# Patient Record
Sex: Male | Born: 1960 | Race: White | Hispanic: No | Marital: Married | Smoking: Never smoker
Health system: Southern US, Community
[De-identification: ages and names within clinical notes are randomized; demographics above are authoritative.]

## PROBLEM LIST (undated history)

## (undated) DIAGNOSIS — Z789 Other specified health status: Secondary | ICD-10-CM

## (undated) HISTORY — PX: VASECTOMY: SHX75

## (undated) HISTORY — PX: KNEE SURGERY: SHX244

---

## 2006-08-20 ENCOUNTER — Ambulatory Visit: Payer: Self-pay | Admitting: Cardiology

## 2006-09-07 ENCOUNTER — Ambulatory Visit: Payer: Self-pay

## 2006-09-20 ENCOUNTER — Ambulatory Visit: Payer: Self-pay | Admitting: Cardiology

## 2010-08-26 NOTE — Assessment & Plan Note (Signed)
[facility]                            CARDIOLOGY OFFICE NOTE   [name], [name]                        MRN:          [id number]  DATE:09/20/2006                            DOB:          06-25-1960    PRIMARY CARE:  Dr. Hal Hope, [facility].   REASON FOR VISIT:  Followup cardiac testing.   HISTORY OF PRESENT ILLNESS:  I saw Justin Wood back in May.  He had a  history of atypical chest pain which was potentially gastrointestinal  based on description, although he had not undergone any prior cardiac  risk stratification.  His resting electrocardiogram showed nonspecific T  wave changes.  I referred him for an exercise Myoview which was low  risk, showing an ejection fraction of 68% with no evidence of scar or  ischemia.  I reviewed these results with him today.  He states that he  has been trying to modify his diet and reports that his symptoms have  generally improved.   ALLERGIES:  No known drug allergies.   PRESENT MEDICATIONS:  1. Aspirin 81 mg p.o. daily.  2. Prilosec 20 mg p.o. daily.   REVIEW OF SYSTEM:  As per history of present illness.   EXAMINATION:  Blood pressure 108/73, heart rate 71, weight 206 pounds.  The patient is comfortable and in no acute distress.  NECK:  No elevated jugular venous pressure, without bruits.  LUNGS:  Are clear to breathing.  CARDIAC:      Reveals a regular rate and rhythm without murmur or  gallop.  EXTREMITIES:  Show no pitting edema.   IMPRESSION AND RECOMMENDATIONS:  1. A history of atypical chest pain, possibly gastrointestinal in      etiology.  Recent exercise Myoview was low risk, overall      reassuring.  I would not anticipate any additional cardiac studies      at this point.  I have recommended that he continue symptom      observation and regular followup with his primary care physician.  2. Cardiology followup as needed.     Jonelle Sidle, MD  Electronically  Signed   SGM/MedQ  DD: 09/20/2006  DT: 09/20/2006  Job #: [id number]   cc:   Clydie Braun L. Hal Hope, M.D.

## 2010-08-26 NOTE — Assessment & Plan Note (Signed)
[facility]                            CARDIOLOGY OFFICE NOTE   Justin Wood, Justin Wood                        MRN:          [id number]  DATE:08/20/2006                            DOB:          03-23-1961    CARDIOLOGY CONSULTATION  The patient is referred from [facility], Dr.  Hal Hope.   REASON FOR CONSULTATION:  Chest pain.   HISTORY OF PRESENT ILLNESS:  Justin Wood is a 50 year old male with no  personal history of coronary artery disease, hypertension,  hyperlipidemia, type 2 diabetes mellitus, or tobacco use.  He also has  no family history of cardiovascular disease by report.  He states that,  over the last 6 months he has been experiencing a heaviness in his chest  intermittently to start and then almost continuously.  Exacerbating  factors include emotional stress, and also when he eats too fast.  He  has had some episodes at night time when he would wake up with a  burning in his throat and a feeling that he needed to vomit.  He has  experienced dyspnea on exertion as well.  Back in 1997, he had an  exercise treadmill test, which was reportedly normal.  He has not had  any followup testing other than some recent rest electrocardiograms,  which showed sinus rhythm with nonspecific T wave changes and normal  intervals.  He was given a prescription for proton pump inhibitor, but  has not yet filled this.  He is referred today to discuss additional  risk stratification.   ALLERGIES:  No known drug allergies.   PRESENT MEDICATIONS:  None chronically.   PAST MEDICAL HISTORY:  As outlined above.  No reported major surgeries  or other medical conditions.   REVIEW OF SYSTEMS:  As described in the history of present illness.  He  does experience occasional congestion.  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  He is not exercising  regularly.   SOCIAL HISTORY:  The patient is married and  has 1 child.  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  He has no tobacco or alcohol use  history.   FAMILY HISTORY:  Noncontributory for premature cardiovascular disease.   EXAMINATION:  Blood pressure is 100/79, heart rate 82, weight 206  pounds.  The patient is comfortable and in no acute distress.  HEENT:  Conjunctivae look normal.  Oropharynx is clear.  NECK:  Supple.  No elevated jugular venous pressure or loud bruits.  No  thyromegaly is noted.  LUNGS:  Clear without labored breathing at rest.  CARDIAC:  Regular rate and rhythm.  No loud murmur or gallop.  No  thyromegaly.  No S3 gallop.  No pericardial rub.  ABDOMEN:  Soft and nontender.  No bruits.  EXTREMITIES:  No pitting edema.  SKIN:  Warm and dry.  MUSCULOSKELETAL:  No kyphosis noted.  NEURO/PSYCH:  The patient is alert and oriented x3.  Affect is normal.   IMPRESSION/RECOMMENDATIONS:  1. Atypical chest pain, nearly continuous at this  point, and      potentially suggestive of a gastrointestinal etiology.  He has not      had any followup ischemic testing/risk stratification, however.      Electrocardiogram is nonspecific at rest.  We discussed potential      avenues for diagnosis, including both noninvasive and invasive      techniques.  At this point, he is most comfortable with noninvasive      testing.  We will proceed with an exercise Myoview.  Will have him      follow up in the clinic to discuss the results.  In the meanwhile,      I have encouraged him to begin the trial of proton pump inhibitor.  2. Further plan is to follow.     Jonelle Sidle, MD  Electronically Signed    SGM/MedQ  DD: 08/20/2006  DT: 08/20/2006  Job #: [id number]   cc:   Clydie Braun L. Hal Hope, M.D.

## 2011-04-26 ENCOUNTER — Emergency Department (HOSPITAL_COMMUNITY): Payer: Commercial Managed Care - PPO

## 2011-04-26 ENCOUNTER — Observation Stay (HOSPITAL_COMMUNITY)
Admission: EM | Admit: 2011-04-26 | Discharge: 2011-04-28 | Disposition: A | Discharge status: Home / Self Care | Payer: Commercial Managed Care - PPO | Attending: Internal Medicine | Admitting: Internal Medicine

## 2011-04-26 ENCOUNTER — Other Ambulatory Visit: Payer: Self-pay

## 2011-04-26 ENCOUNTER — Encounter (HOSPITAL_COMMUNITY): Payer: Self-pay | Admitting: Adult Health

## 2011-04-26 DIAGNOSIS — R29898 Other symptoms and signs involving the musculoskeletal system: Secondary | ICD-10-CM | POA: Insufficient documentation

## 2011-04-26 DIAGNOSIS — R109 Unspecified abdominal pain: Secondary | ICD-10-CM | POA: Insufficient documentation

## 2011-04-26 DIAGNOSIS — K219 Gastro-esophageal reflux disease without esophagitis: Secondary | ICD-10-CM | POA: Insufficient documentation

## 2011-04-26 DIAGNOSIS — R12 Heartburn: Secondary | ICD-10-CM | POA: Insufficient documentation

## 2011-04-26 DIAGNOSIS — R209 Unspecified disturbances of skin sensation: Secondary | ICD-10-CM | POA: Insufficient documentation

## 2011-04-26 DIAGNOSIS — R0602 Shortness of breath: Secondary | ICD-10-CM | POA: Insufficient documentation

## 2011-04-26 DIAGNOSIS — R079 Chest pain, unspecified: Secondary | ICD-10-CM | POA: Diagnosis present

## 2011-04-26 DIAGNOSIS — R61 Generalized hyperhidrosis: Secondary | ICD-10-CM | POA: Insufficient documentation

## 2011-04-26 DIAGNOSIS — R0789 Other chest pain: Principal | ICD-10-CM | POA: Insufficient documentation

## 2011-04-26 HISTORY — DX: Other specified health status: Z78.9

## 2011-04-26 LAB — LIPASE, BLOOD: Lipase: 38 U/L (ref 11–59)

## 2011-04-26 LAB — HEPATIC FUNCTION PANEL: Total Protein: 6.9 g/dL (ref 6.0–8.3)

## 2011-04-26 LAB — BASIC METABOLIC PANEL
GFR calc Af Amer: >90 mL/min (ref >90)
GFR calc non Af Amer: >90 mL/min (ref >90)
Glucose, Bld: 106 mg/dL — ABNORMAL HIGH (ref 70–99)
Potassium: 3.9 mEq/L (ref 3.5–5.1)
Sodium: 137 mEq/L (ref 135–145)

## 2011-04-26 LAB — CBC
Hemoglobin: 14.9 g/dL (ref 13.0–17.0)
MCHC: 34.7 g/dL (ref 30.0–36.0)
RDW: 12.2 % (ref 11.5–15.5)

## 2011-04-26 LAB — POCT I-STAT TROPONIN I: Troponin i, poc: 0 ng/mL (ref 0.00–0.08)

## 2011-04-26 MED ORDER — ASPIRIN 325 MG PO TABS
325.0000 mg | ORAL_TABLET | ORAL | Status: AC
  Administered 2011-04-26: 325 mg via ORAL

## 2011-04-26 MED ORDER — GI COCKTAIL ~~LOC~~
30.0000 mL | Freq: Once | ORAL | Status: AC
  Administered 2011-04-26: 30 mL via ORAL
  Filled 2011-04-26: qty 30

## 2011-04-26 MED ORDER — MORPHINE SULFATE 4 MG/ML IJ SOLN
4.0000 mg | Freq: Once | INTRAMUSCULAR | Status: DC
  Filled 2011-04-26: qty 1

## 2011-04-26 MED ORDER — NITROGLYCERIN 0.4 MG SL SUBL
0.4000 mg | SUBLINGUAL_TABLET | SUBLINGUAL | Status: DC | PRN
  Administered 2011-04-26: 0.4 mg via SUBLINGUAL
  Filled 2011-04-26: qty 25

## 2011-04-26 MED ORDER — NITROGLYCERIN 0.4 MG SL SUBL: 0.4000 mg | SUBLINGUAL_TABLET | SUBLINGUAL | Status: DC | PRN

## 2011-04-26 MED ORDER — PANTOPRAZOLE SODIUM 40 MG IV SOLR
40.0000 mg | Freq: Once | INTRAVENOUS | Status: AC
  Administered 2011-04-26: 40 mg via INTRAVENOUS
  Filled 2011-04-26: qty 40

## 2011-04-26 NOTE — ED Notes (Signed)
Reports chest pain that began in church described as heavy and dull that radiates to left arm associated with SOB and diaphoresis

## 2011-04-26 NOTE — H&P (Addendum)
Primary Care Physician: None   Chief Complaint: Chest pain for one day  History of Present Illness: Patient is a 51 year old gentleman with occasional heartburn but otherwise healthy who presents for evaluation of chest pain. Patient reports that he was in his usual state of health until earlier today when he was at church and experienced a dull pain arising from the lower aspect of the left chest. Reports associated diaphoresis and weakness/numbness of his left arm. No other radiation. No headache, vision changes, shortness of breath, nausea or vomiting. Reports having occasional GERD/heartburn symptoms and reporting that this pain is nothing like the burning sensation that he gets. Had to have a stress test approximately 20 years ago for similar pain which was reportedly negative. Reports that he does not actively exercise but is able to go up a flight of stairs and perform other activities of daily living without any difficulty. No recent trauma. No history of tobacco. Does not take any regular medications at home.  No recent dyspnea on exertion, orthopnea, or lower extremity edema.  In the emergency room, temperature 97.8, blood pressure 132/79, heart rate 92, respirations 16, satting 95% on room air. Initial EKG and troponin negative for ischemic changes. Chest radiograph negative for any acute process. Chest pain slightly improved after administration of a full dose aspirin, nitroglycerin, and morphine. Protonix also given. GI cocktail reportedly made pain worse. The patient was admitted for further evaluation and management.  Past Medical/Surgical History: Occasional heartburn symptoms, symptomatically treated  Allergies: No known drug allergies  Medications: None  Family History: No history of CAD or MI  Social History: Married, works as a Counsellor Denies any tobacco, EtOH, or illicits  Review of Systems: General: No fevers, chills, sweats, night sweats, weight loss Skin: No  rashes or lacerations HEENT: No rhinorrhea, sore throat, dry mouth, hearing difficulties Pulmonary: No cough, wheezing, shortness of breath Cardivascular: As per HPI Gastrointestinal: As per HPI Genitourinary: No dysuria, hematuria, increased urinary frequency/urgency. No discharge Musculoskeletal: No muscle aches, pain. No arthritis Hematologic: No easy bruising or bleeding Neurologic: No headaches, vision changes, focal neurologic deficits Psychologic: No suicidial or homicidal ideation. No depression  Filed Vitals:   04/26/11 1408 04/26/11 1454 04/26/11 1818  BP: 132/79 125/70 120/69  Pulse: 92 87 68  Temp: 97.8 F (36.6 C)    TempSrc: Oral    Resp: 16  18  SpO2: 95% 100% 100%    Physical Exam: General: Alert and oriented x 3, no apparent distress Skin: No rashes, bruises HEENT: Head atraumatic, sclera anicertic, pupils equal and reactive to light, oropharynx moist with tonsils unremarkable Neck: Soft, no lymphadenopathy, thyromegaly, or bruits Chest: Clear to auscultation bilaterally, no wheezes, rales, or ronchi Heart: Regular rate and rhythm, normal S1/S2 no rubs, gallops, or murmurs Abdomen: Soft, nontender, nondistended, + bowel sounds, no masses Extremities: No cyanosis, clubbing, or edema. 2+ radial and dorsalis pedis pulses bilaterally Neurologic: Grossly intact   Labs: CBC    Component Value Date/Time   WBC 7.5 04/26/2011 1440   RBC 4.82 04/26/2011 1440   HGB 14.9 04/26/2011 1440   HCT 43.0 04/26/2011 1440   PLT 272 04/26/2011 1440   MCV 89.2 04/26/2011 1440   MCH 30.9 04/26/2011 1440   MCHC 34.7 04/26/2011 1440   RDW 12.2 04/26/2011 1440    BMET    Component Value Date/Time   NA 137 04/26/2011 1440   K 3.9 04/26/2011 1440   CL 102 04/26/2011 1440   CO2 25 04/26/2011 1440  GLUCOSE 106* 04/26/2011 1440   BUN 14 04/26/2011 1440   CREATININE 0.79 04/26/2011 1440   CALCIUM 9.6 04/26/2011 1440   GFRNONAA >90 04/26/2011 1440   GFRAA >90 04/26/2011 1440    Liver  function tests: AST 21, ALT 31, alkaline phosphatase 70, total bilirubin 0.4, total protein 6.9, albumin 3.6  Troponin 0.00  EKG (my read): Sinus rhythm with no ischemic changes  Chest radiograph: IMPRESSION:  Low lung volumes, otherwise no acute cardiopulmonary abnormality.   Impression/Plan: 51 year old gentleman with occasional heartburn but otherwise healthy who presents for evaluation of chest pain potential cardiac etiology, currently afebrile and hemodynamically stable with no ischemic changes on EKG and initial troponin negative. The patient does not have any traditional risk factors. However, history is concerning for possible angina.  Differential considerations also include possible GI etiology although less likely especially given pain worsened with GI cocktail.   Left sided chest pain: As above - Admit to telemetry - Monitor serial cardiac enzymes - Discussed with cardiologist on call, will place for exercise stress test in the morning - Continue Aspirin for now - NPO post midnight  Fluid/electrolytes/nutrition: - Hep-locked IV - Monitor electrolytes daily - Cardiac diet, NPO post midnight as above  Prophylaxis: - Lovenox  CODE STATUS: Full code

## 2011-04-26 NOTE — ED Provider Notes (Signed)
History     CSN: [id number]  Arrival date & time 04/26/11  1356   First MD Initiated Contact with Patient 04/26/11 1512      Chief Complaint  Patient presents with  . Chest Pain    (Consider location/radiation/quality/duration/timing/severity/associated sxs/prior treatment) Patient is a 51 y.o. male presenting with chest pain. The history is provided by the patient.  Chest Pain Duration of episode(s) is 3 months. The chest pain is improving. The pain is associated with eating. The quality of the pain is described as aching. The pain does not radiate. Chest pain is worsened by eating. Primary symptoms include shortness of breath and abdominal pain. Pertinent negatives for primary symptoms include no fever, no cough, no wheezing, no palpitations, no nausea, no vomiting and no dizziness.  Associated symptoms include diaphoresis.  Pertinent negatives for associated symptoms include no numbness and no weakness.   Pt states he has had episodic epigastric/chest pain since thanksgiving. This morning pt ate and began to have sharp epigastric pain. Became diaphoretic. Pain has improved but now feels dull in nature. No fever chill diarrhea   History reviewed. No pertinent past medical history.  History reviewed. No pertinent past surgical history.  History reviewed. No pertinent family history.  History  Substance Use Topics  . Smoking status: Never Smoker   . Smokeless tobacco: Not on file  . Alcohol Use: No      Review of Systems  Constitutional: Positive for diaphoresis. Negative for fever.  Respiratory: Positive for shortness of breath. Negative for cough, chest tightness and wheezing.   Cardiovascular: Positive for chest pain. Negative for palpitations.  Gastrointestinal: Positive for abdominal pain. Negative for nausea, vomiting, diarrhea, constipation and abdominal distention.  Skin: Negative for color change, pallor and rash.  Neurological: Negative for dizziness, weakness  and numbness.    Allergies  Review of patient's allergies indicates no known allergies.  Home Medications  No current outpatient prescriptions on file.  BP 125/70  Pulse 87  Temp(Src) 97.8 F (36.6 C) (Oral)  Resp 16  SpO2 100%  Physical Exam  Nursing note and vitals reviewed. Constitutional: He is oriented to person, place, and time. He appears well-developed and well-nourished. No distress.  HENT:  Head: Normocephalic and atraumatic.  Mouth/Throat: Oropharynx is clear and moist.  Eyes: EOM are normal. Pupils are equal, round, and reactive to light.  Neck: Normal range of motion. Neck supple.  Cardiovascular: Normal rate and regular rhythm.   Pulmonary/Chest: Effort normal and breath sounds normal. No respiratory distress. He has no wheezes. He has no rales.  Abdominal: Soft. Bowel sounds are normal. He exhibits no distension and no mass. There is no tenderness. There is no rebound and no guarding.  Musculoskeletal: Normal range of motion. He exhibits no edema and no tenderness.  Neurological: He is alert and oriented to person, place, and time.  Skin: Skin is warm and dry. No rash noted. No erythema.  Psychiatric: He has a normal mood and affect. His behavior is normal.    ED Course  Procedures (including critical care time)  Labs Reviewed  BASIC METABOLIC PANEL - Abnormal; Notable for the following:    Glucose, Bld 106 (*)    All other components within normal limits  CBC  POCT I-STAT TROPONIN I  HEPATIC FUNCTION PANEL  LIPASE, BLOOD  POCT CARDIAC MARKERS  I-STAT TROPONIN I   Dg Chest 2 View  04/26/2011  *RADIOLOGY REPORT*  Clinical Data: 51 year old male with chest pain and shortness of breath.  CHEST - 2 VIEW  Comparison: None.  Findings: Low lung volumes. Normal cardiac size and mediastinal contours.  Visualized tracheal air column is within normal limits. No pneumothorax, pulmonary edema, pleural effusion or consolidation.  Crowding of markings at both lung  bases.  Negative visualized bowel gas pattern. No acute osseous abnormality identified.  IMPRESSION: Low lung volumes, otherwise no acute cardiopulmonary abnormality.  Original Report Authenticated By: Harley Hallmark, M.D.     No diagnosis found.   Date: 04/26/2011  Rate: 89  Rhythm: normal sinus rhythm  QRS Axis: normal  Intervals: normal  ST/T Wave abnormalities: normal  Conduction Disutrbances:none  Narrative Interpretation:   Old EKG Reviewed: none available    MDM   Pain unchanged. Will admit for r/o.         Loren Racer, MD 04/26/11 1827

## 2011-04-27 ENCOUNTER — Encounter (HOSPITAL_COMMUNITY): Payer: Self-pay

## 2011-04-27 ENCOUNTER — Ambulatory Visit (HOSPITAL_COMMUNITY): Admit: 2011-04-27 | Payer: Commercial Managed Care - PPO

## 2011-04-27 LAB — CARDIAC PANEL(CRET KIN+CKTOT+MB+TROPI)
CK, MB: 2.6 ng/mL (ref 0.3–4.0)
CK, MB: 2.4 ng/mL (ref 0.3–4.0)
Relative Index: 2.3 (ref 0.0–2.5)
Relative Index: INVALID (ref 0.0–2.5)
Total CK: 104 U/L (ref 7–232)
Total CK: 103 U/L (ref 7–232)
Total CK: 98 U/L (ref 7–232)

## 2011-04-27 MED ORDER — SODIUM CHLORIDE 0.9 % IJ SOLN: 3.0000 mL | INTRAMUSCULAR | Status: DC | PRN

## 2011-04-27 MED ORDER — SODIUM CHLORIDE 0.9 % IV SOLN: 250.0000 mL | INTRAVENOUS | Status: DC | PRN

## 2011-04-27 MED ORDER — ENOXAPARIN SODIUM 40 MG/0.4ML ~~LOC~~ SOLN
40.0000 mg | SUBCUTANEOUS | Status: DC
  Administered 2011-04-27: 40 mg via SUBCUTANEOUS
  Filled 2011-04-27 (×2): qty 0.4

## 2011-04-27 MED ORDER — ASPIRIN EC 81 MG PO TBEC
81.0000 mg | DELAYED_RELEASE_TABLET | Freq: Every day | ORAL | Status: DC
  Administered 2011-04-28: 81 mg via ORAL
  Filled 2011-04-27 (×2): qty 1

## 2011-04-27 MED ORDER — ONDANSETRON HCL 4 MG/2ML IJ SOLN: 4.0000 mg | Freq: Four times a day (QID) | INTRAMUSCULAR | Status: DC | PRN

## 2011-04-27 MED ORDER — NITROGLYCERIN 0.4 MG SL SUBL: 0.4000 mg | SUBLINGUAL_TABLET | SUBLINGUAL | Status: DC | PRN

## 2011-04-27 MED ORDER — SODIUM CHLORIDE 0.9 % IJ SOLN
3.0000 mL | Freq: Two times a day (BID) | INTRAMUSCULAR | Status: DC
  Administered 2011-04-27: 3 mL via INTRAVENOUS

## 2011-04-27 MED ORDER — ACETAMINOPHEN 325 MG PO TABS
650.0000 mg | ORAL_TABLET | ORAL | Status: DC | PRN
  Administered 2011-04-27 – 2011-04-28 (×2): 650 mg via ORAL
  Filled 2011-04-27 (×2): qty 2

## 2011-04-27 NOTE — ED Notes (Signed)
Coordination between EKG department at [facility] and Dr. Eldridge Dace completed. The patient has an appointment at 3:30PM and should arrive at [facility]. [facility] called for this transport.  Pt and family made aware. Pt has not eaten since 11 and encouraged to remain NPO until after test completion.

## 2011-04-27 NOTE — Progress Notes (Signed)
Exercise Treadmill Test:  Referring Cardiologist: Dr. Armanda Magic  10:00 on treadmill.  No ECG changes.  No change in symptoms.  No ischemia.  Normal HR recovery.  Impressions:  1) Normal ETT.  Good exercise tolerance.  Justin Wood S. 04/27/2011

## 2011-04-27 NOTE — ED Notes (Signed)
Report given to [facility].  ETA approximately 10 minutes. Pt aware.Family at bedside

## 2011-04-27 NOTE — ED Notes (Signed)
Attempting to coordinate stress testing.  Pt family made aware.  EKG stated that the patient must have ETT completed at [facility].  This test would require the MD to be present and MD will also be required to state a time. Pt transport to [facility] must then be coordinated and patient will most likely need to be tested tomorrow as he ate food brought in by pt family. Pt family and pt made aware and told further information would be provided after speaking with MD.  MD paged.

## 2011-04-27 NOTE — ED Notes (Signed)
Pt taken to Stress Test by [facility]

## 2011-04-27 NOTE — Progress Notes (Signed)
DAILY PROGRESS NOTE                              [facility]AL [facility]  SUBJECTIVE: Patient seen while the airline personnel waiting for him to go to [facility] for the stress test. He mentioned 3/10 inframammary dull pain.  OBJECTIVE: BP 136/75  Pulse 99  Temp(Src) 97.7 F (36.5 C) (Oral)  Resp 24  SpO2 97% No intake or output data in the 24 hours ending 04/27/11 1452                    Weight change:  Physical Exam: General: Alert and awake oriented x3 not in any acute distress. HEENT: anicteric sclera, pupils equal reactive to light and accommodation CVS: S1-S2 heard, no murmur rubs or gallops Chest: clear to auscultation bilaterally, no wheezing rales or rhonchi Abdomen:  normal bowel sounds, soft, nontender, nondistended, no organomegaly Neuro: Cranial nerves II-XII intact, no focal neurological deficits Extremities: no cyanosis, no clubbing or edema noted bilaterally   Lab Results:  Basename 04/26/11 1440  NA 137  K 3.9  CL 102  CO2 25  GLUCOSE 106*  BUN 14  CREATININE 0.79  CALCIUM 9.6  MG --  PHOS --    Basename 04/26/11 1551  AST 21  ALT 31  ALKPHOS 70  BILITOT 0.4  PROT 6.9  ALBUMIN 3.6    Basename 04/26/11 1551  LIPASE 38  AMYLASE --    Basename 04/26/11 1440  WBC 7.5  NEUTROABS --  HGB 14.9  HCT 43.0  MCV 89.2  PLT 272    Basename 04/27/11 1205 04/27/11 0810  CKTOTAL 103 104  CKMB 2.4 2.6  CKMBINDEX -- --  TROPONINI <0.30 <0.30   Micro Results: No results found for this or any previous visit (from the past 240 hour(s)).  Studies/Results: Dg Chest 2 View  04/26/2011  *RADIOLOGY REPORT*  Clinical Data: 51 year old male with chest pain and shortness of breath.  CHEST - 2 VIEW  Comparison: None.  Findings: Low lung volumes. Normal cardiac size and mediastinal contours.  Visualized tracheal air column is within normal limits. No pneumothorax, pulmonary edema, pleural effusion or consolidation.  Crowding of  markings at both lung bases.  Negative visualized bowel gas pattern. No acute osseous abnormality identified.  IMPRESSION: Low lung volumes, otherwise no acute cardiopulmonary abnormality.  Original Report Authenticated By: Harley Hallmark, M.D.   Medications: Scheduled Meds:   . aspirin EC  81 mg Oral Daily  . enoxaparin  40 mg Subcutaneous Q24H  . gi cocktail  30 mL Oral Once  .  morphine injection  4 mg Intravenous Once  . pantoprazole (PROTONIX) IV  40 mg Intravenous Once  . sodium chloride  3 mL Intravenous Q12H   Continuous Infusions:  PRN Meds:.sodium chloride, acetaminophen, nitroGLYCERIN, nitroGLYCERIN, ondansetron (ZOFRAN) IV, sodium chloride  ASSESSMENT & PLAN: Principal Problem:  *Chest pain   1. Chest pain: Patient placed on observation for chest pain. This is atypical chest pain characterized by dull pain rising from the lower aspect of the left side of his chest. The patient is if not reproducible, associated with diaphoresis. Patient is getting exercise stress test today it [place]. Disposition depends on the test results.   LOS: 1 day   Maesyn Frisinger A 04/27/2011, 2:52 PM

## 2011-04-28 ENCOUNTER — Other Ambulatory Visit: Payer: Self-pay

## 2011-04-28 LAB — CBC
HCT: 42.5 % (ref 39.0–52.0)
Hemoglobin: 14.5 g/dL (ref 13.0–17.0)
RBC: 4.73 MIL/uL (ref 4.22–5.81)

## 2011-04-28 LAB — BASIC METABOLIC PANEL
CO2: 28 mEq/L (ref 19–32)
Calcium: 9.5 mg/dL (ref 8.4–10.5)
Creatinine, Ser: 1 mg/dL (ref 0.50–1.35)
GFR calc non Af Amer: 85 mL/min — ABNORMAL LOW (ref >90)
Glucose, Bld: 79 mg/dL (ref 70–99)
Sodium: 137 mEq/L (ref 135–145)

## 2011-04-28 LAB — LIPID PANEL
Cholesterol: 189 mg/dL (ref 0–200)
LDL Cholesterol: 121 mg/dL — ABNORMAL HIGH (ref 0–99)
Triglycerides: 196 mg/dL — ABNORMAL HIGH (ref <150)
VLDL: 39 mg/dL (ref 0–40)

## 2011-04-28 NOTE — Discharge Summary (Addendum)
HOSPITAL DISCHARGE SUMMARY  Justin Wood  MRN: [id number]  DOB:1960/05/04  Date of Admission: 04/26/2011 Date of Discharge: 04/28/2011         LOS: 2 days   Attending Physician:Seaira Byus A  Patient's PCP:No primary provider on file.  Consults:   none  Discharge Diagnoses: Present on Admission:  .Chest pain   There are no discharge medications for this patient.    Brief Admission History: Patient is a 51 year old gentleman with occasional heartburn but otherwise healthy who presents for evaluation of chest pain. Patient reports that he was in his usual state of health until earlier today when he was at church and experienced a dull pain arising from the lower aspect of the left chest. Reports associated diaphoresis and weakness/numbness of his left arm. No other radiation. No headache, vision changes, shortness of breath, nausea or vomiting. Reports having occasional GERD/heartburn symptoms and reporting that this pain is nothing like the burning sensation that he gets. Had to have a stress test approximately 20 years ago for similar pain which was reportedly negative. Reports that he does not actively exercise but is able to go up a flight of stairs and perform other activities of daily living without any difficulty. No recent trauma. No history of tobacco. Does not take any regular medications at home. No recent dyspnea on exertion, orthopnea, or lower extremity edema.  In the emergency room, temperature 97.8, blood pressure 132/79, heart rate 92, respirations 16, satting 95% on room air. Initial EKG and troponin negative for ischemic changes. Chest radiograph negative for any acute process. Chest pain slightly improved after administration of a full dose aspirin, nitroglycerin, and morphine. Protonix also given. GI cocktail reportedly made pain worse. The patient was admitted for further evaluation and management.  Hospital Course: Present on Admission:  .Chest pain  1. Chest  pain: Patient admitted to the hospital for further evaluation. The chest pain seems atypical with a dull aching pain instrument mammary and did not go away with the nitroglycerin paste. At the time of admission patient was evaluated by his chest x-ray which negative for any acute process. 3 sets of cardiac enzymes and EKG were negative for evidence of acute ischemia. Patient was sent to [facility] for exercise stress test done by Dr. Marquis Lunch. This is seems to be negative without any EKG changes. Patient does have good exercise tolerance. This morning patient said it just still have 1-2/10 pain. Which goes away with Tylenol and Advil. Patient was felt appropriate to be discharged home to followup with primary care physician. His lipid panel attached to this discharge summary.   Day of Discharge BP 107/68  Pulse 73  Temp(Src) 97.3 F (36.3 C) (Oral)  Resp 20  Ht 5\' 7"  (1.702 m)  Wt 94.348 kg (208 lb)  BMI 32.58 kg/m2  SpO2 97% Physical Exam: GEN: No acute distress, cooperative with exam PSYCH: He is alert and oriented x4; does not appear anxious does not appear depressed; affect is normal  HEENT: Mucous membranes pink and anicteric;  Mouth: without oral thrush or lesions Eyes: PERRLA; EOM intact;  Neck: no cervical lymphadenopathy nor thyromegaly or carotid bruit; no JVD;  CHEST WALL: No tenderness, symmetrical to breathing bilaterally CHEST: Normal respiration, clear to auscultation bilaterally  HEART: Regular rate and rhythm; no murmurs, rubs or gallops, S1 and S2 heard  BACK: No kyphosis or scoliosis; no CVA tenderness  ABDOMEN:  soft non-tender; no masses, no organomegaly, normal abdominal bowel sounds; no pannus; no intertriginous  candida.  EXTREMITIES: No bone or joint deformity; no edema; no ulcerations.  PULSES: 2+ and symmetric, neurovascularity is intact SKIN: Normal hydration no rash or ulceration, no flushing or suspicious lesions  CNS: Cranial nerves 2-12 grossly  intact no focal neurologic deficit, coordination is intact gait not tested    Results for orders placed during the hospital encounter of 04/26/11 (from the past 24 hour(s))  CARDIAC PANEL(CRET KIN+CKTOT+MB+TROPI)     Status: Normal   Collection Time   04/27/11 12:05 PM      Component Value Range   Total CK 103  7 - 232 (U/L)   CK, MB 2.4  0.3 - 4.0 (ng/mL)   Troponin I <0.30  <0.30 (ng/mL)   Relative Index 2.3  0.0 - 2.5   CARDIAC PANEL(CRET KIN+CKTOT+MB+TROPI)     Status: Normal   Collection Time   04/27/11  5:45 PM      Component Value Range   Total CK 98  7 - 232 (U/L)   CK, MB 2.4  0.3 - 4.0 (ng/mL)   Troponin I <0.30  <0.30 (ng/mL)   Relative Index RELATIVE INDEX IS INVALID  0.0 - 2.5   BASIC METABOLIC PANEL     Status: Abnormal   Collection Time   04/28/11 12:10 AM      Component Value Range   Sodium 137  135 - 145 (mEq/L)   Potassium 4.0  3.5 - 5.1 (mEq/L)   Chloride 101  96 - 112 (mEq/L)   CO2 28  19 - 32 (mEq/L)   Glucose, Bld 79  70 - 99 (mg/dL)   BUN 14  6 - 23 (mg/dL)   Creatinine, Ser 1.61  0.50 - 1.35 (mg/dL)   Calcium 9.5  8.4 - 09.6 (mg/dL)   GFR calc non Af Amer 85 (*) >90 (mL/min)   GFR calc Af Amer >90  >90 (mL/min)  CBC     Status: Normal   Collection Time   04/28/11 12:10 AM      Component Value Range   WBC 8.5  4.0 - 10.5 (K/uL)   RBC 4.73  4.22 - 5.81 (MIL/uL)   Hemoglobin 14.5  13.0 - 17.0 (g/dL)   HCT 04.5  40.9 - 81.1 (%)   MCV 89.9  78.0 - 100.0 (fL)   MCH 30.7  26.0 - 34.0 (pg)   MCHC 34.1  30.0 - 36.0 (g/dL)   RDW 91.4  78.2 - 95.6 (%)   Platelets 263  150 - 400 (K/uL)  LIPID PANEL     Status: Abnormal   Collection Time   04/28/11 12:11 AM      Component Value Range   Cholesterol 189  0 - 200 (mg/dL)   Triglycerides 213 (*) <150 (mg/dL)   HDL 29 (*) >08 (mg/dL)   Total CHOL/HDL Ratio 6.5     VLDL 39  0 - 40 (mg/dL)   LDL Cholesterol 657 (*) 0 - 99 (mg/dL)    Disposition: Home   Follow-up Appts: Discharge Orders    Future Orders  Please Complete By Expires   Diet - low sodium heart healthy      Increase activity slowly         Follow-up Information    Follow up with Primary care physician. Schedule an appointment as soon as possible for a visit in 2 weeks.         I spent 40 minutes completing paperwork and coordinating discharge efforts.  SignedClydia Llano A 04/28/2011, 9:53 AM

## 2011-04-29 NOTE — Progress Notes (Signed)
UR completed 

## 2012-03-29 ENCOUNTER — Encounter (HOSPITAL_COMMUNITY): Payer: Self-pay | Admitting: *Deleted

## 2012-03-29 ENCOUNTER — Emergency Department (HOSPITAL_COMMUNITY): Payer: Commercial Managed Care - PPO

## 2012-03-29 ENCOUNTER — Emergency Department (HOSPITAL_COMMUNITY)
Admission: EM | Admit: 2012-03-29 | Discharge: 2012-03-29 | Disposition: A | Discharge status: Home / Self Care | Payer: Commercial Managed Care - PPO | Attending: Emergency Medicine | Admitting: Emergency Medicine

## 2012-03-29 DIAGNOSIS — R109 Unspecified abdominal pain: Secondary | ICD-10-CM | POA: Insufficient documentation

## 2012-03-29 DIAGNOSIS — M549 Dorsalgia, unspecified: Secondary | ICD-10-CM

## 2012-03-29 DIAGNOSIS — R404 Transient alteration of awareness: Secondary | ICD-10-CM | POA: Insufficient documentation

## 2012-03-29 DIAGNOSIS — IMO0002 Reserved for concepts with insufficient information to code with codable children: Secondary | ICD-10-CM | POA: Insufficient documentation

## 2012-03-29 DIAGNOSIS — Y929 Unspecified place or not applicable: Secondary | ICD-10-CM | POA: Insufficient documentation

## 2012-03-29 DIAGNOSIS — Y939 Activity, unspecified: Secondary | ICD-10-CM | POA: Insufficient documentation

## 2012-03-29 DIAGNOSIS — R55 Syncope and collapse: Secondary | ICD-10-CM | POA: Insufficient documentation

## 2012-03-29 DIAGNOSIS — X500XXA Overexertion from strenuous movement or load, initial encounter: Secondary | ICD-10-CM | POA: Insufficient documentation

## 2012-03-29 LAB — POCT I-STAT, CHEM 8
Chloride: 105 mEq/L (ref 96–112)
Glucose, Bld: 90 mg/dL (ref 70–99)
HCT: 49 % (ref 39.0–52.0)
Hemoglobin: 16.7 g/dL (ref 13.0–17.0)
Potassium: 5.2 mEq/L — ABNORMAL HIGH (ref 3.5–5.1)
Sodium: 141 mEq/L (ref 135–145)

## 2012-03-29 MED ORDER — DIAZEPAM 5 MG PO TABS: 5.0000 mg | ORAL_TABLET | Freq: Four times a day (QID) | ORAL | Status: DC | PRN

## 2012-03-29 MED ORDER — HYDROMORPHONE HCL PF 1 MG/ML IJ SOLN
1.0000 mg | Freq: Once | INTRAMUSCULAR | Status: AC
  Administered 2012-03-29: 1 mg via INTRAVENOUS
  Filled 2012-03-29: qty 1

## 2012-03-29 MED ORDER — OXYCODONE-ACETAMINOPHEN 5-325 MG PO TABS: 1.0000 | ORAL_TABLET | ORAL | Status: DC | PRN

## 2012-03-29 MED ORDER — IBUPROFEN 600 MG PO TABS: 600.0000 mg | ORAL_TABLET | Freq: Three times a day (TID) | ORAL | Status: DC | PRN

## 2012-03-29 MED ORDER — SODIUM CHLORIDE 0.9 % IV SOLN
Freq: Once | INTRAVENOUS | Status: AC
  Administered 2012-03-29: 11:00:00 via INTRAVENOUS

## 2012-03-29 MED ORDER — DIAZEPAM 5 MG PO TABS
5.0000 mg | ORAL_TABLET | Freq: Once | ORAL | Status: AC
  Administered 2012-03-29: 5 mg via ORAL
  Filled 2012-03-29: qty 1

## 2012-03-29 NOTE — ED Notes (Signed)
Pt escorted to discharge window. Pt verbalized understanding discharge instructions. In no acute distress.  

## 2012-03-29 NOTE — ED Provider Notes (Signed)
History     CSN: [id number]  Arrival date & time 03/29/12  0945   First MD Initiated Contact with Patient 03/29/12 0957      Chief Complaint  Patient presents with  . Loss of Consciousness  . Back Pain    (Consider location/radiation/quality/duration/timing/severity/associated sxs/prior treatment) The history is provided by the patient.   patient reports developing mild low back pain after work.  He was able to do his normal activities through the evening.  This morning when he awoke he continued to have lower abdominal pain that was sharp and shooting.  He then states that he leaned over and developed a severe pain in his low back and heard a "pop" in the next thing he noted he awoke and was lying on the ground with an abrasion the right side of his face.  He thinks that he passed out.  At this time he has no headache or neck pain.  He has no trismus or malocclusion.  He is not on any anticoagulants.  He has no chest pain or shortness of breath.  No history of cardiac issues.  No palpitations.  His pain is still moderate to severe in his low back.  He points to the midline of the very low L5-S1 region.  He denies weakness of his lower extremities.  No numbness or tingling in his lower extremities.  There is no radiation of his pain down his legs.  No abdominal pain nausea or vomiting.  No dysuria or urinary frequency.  His pain is moderate to severe.  His pain is worsened by movement.  His pain is unchanged by palpation.  Past Medical History  Diagnosis Date  . No pertinent past medical history     Past Surgical History  Procedure Date  . Vasectomy     History reviewed. No pertinent family history.  History  Substance Use Topics  . Smoking status: Never Smoker   . Smokeless tobacco: Never Used  . Alcohol Use: No      Review of Systems  Cardiovascular: Positive for syncope.  Musculoskeletal: Positive for back pain.  All other systems reviewed and are  negative.    Allergies  Review of patient's allergies indicates no known allergies.  Home Medications  No current outpatient prescriptions on file.  BP 128/72  Pulse 71  Temp 97.6 F (36.4 C) (Oral)  Resp 18  SpO2 100%  Physical Exam  Nursing note and vitals reviewed. Constitutional: He is oriented to person, place, and time. He appears well-developed and well-nourished.  HENT:  Head: Normocephalic and atraumatic.       Mild tenderness of his right infraorbital rim without step-offs.  Extraocular movements are intact.  No malocclusion or trismus  Eyes: EOM are normal.  Neck: Normal range of motion.  Cardiovascular: Normal rate, regular rhythm, normal heart sounds and intact distal pulses.   Pulmonary/Chest: Effort normal and breath sounds normal. No respiratory distress.  Abdominal: Soft. He exhibits no distension. There is no tenderness.  Musculoskeletal: Normal range of motion.  Neurological: He is alert and oriented to person, place, and time.  Skin: Skin is warm and dry.  Psychiatric: He has a normal mood and affect. Judgment normal.    ED Course  Procedures (including critical care time)   Date: 03/29/2012  Rate: 69  Rhythm: normal sinus rhythm  QRS Axis: normal  Intervals: normal  ST/T Wave abnormalities: normal  Conduction Disutrbances: none  Narrative Interpretation:   Old EKG Reviewed: No significant changes  noted     Labs Reviewed  POCT I-STAT, CHEM 8 - Abnormal; Notable for the following:    Potassium 5.2 (*)     All other components within normal limits   Dg Lumbar Spine Complete  03/29/2012  *RADIOLOGY REPORT*  Clinical Data: Syncope, fall.  Pain.  LUMBAR SPINE - COMPLETE 4+ VIEW  Comparison: None.  Findings: Vertebral body height and alignment are normal. Intervertebral disc space height is maintained with mild anterior endplate spurring noted.  Paraspinous structures unremarkable.  IMPRESSION: No acute finding.   Original Report Authenticated  By: Holley Dexter, M.D.    I personally reviewed the imaging tests through PACS system I reviewed available ER/hospitalization records through the EMR  1. Back pain       MDM  Normal lower extremity neurologic exam. No bowel or bladder complaints. No back pain red flags. Likely musculoskeletal back pain. Doubt spinal epidural abscess. Doubt cauda equina. Doubt abdominal aortic aneurysm     11:48 AM The patient is feeling much better at this time.    Likely home with pain medicine and muscle relaxants.  This is likely musculoskeletal in nature.  12:12 PM The patient and related in the hall without difficulty.  Home with PCP in spine surgery followup.  Understands return to ER for new or worsening symptoms  Lyanne Co, MD 03/29/12 1212

## 2012-03-29 NOTE — ED Notes (Signed)
Patient ambulated 70 feet with no assistance and with out any problems. O2  Saturation stayed at 97% and heart rate was maintained at 72 bets per minuted

## 2012-03-29 NOTE — ED Notes (Signed)
Pt states yesterday started having lower back pain, denies injury, today in shower had a sharp/shooting pain, sat down in shower then woke up on bathroom floor, pt has abrasion to R side of face. Pt still complaining of lower back pain and burning where abrasion is to face. Denies dizziness, blurred vision or shortness of breath.

## 2014-06-10 ENCOUNTER — Emergency Department (HOSPITAL_COMMUNITY): Payer: Managed Care, Other (non HMO)

## 2014-06-10 ENCOUNTER — Encounter (HOSPITAL_COMMUNITY): Payer: Self-pay | Admitting: Emergency Medicine

## 2014-06-10 ENCOUNTER — Emergency Department (HOSPITAL_COMMUNITY)
Admission: EM | Admit: 2014-06-10 | Discharge: 2014-06-10 | Disposition: A | Discharge status: Home / Self Care | Payer: Managed Care, Other (non HMO) | Attending: Emergency Medicine | Admitting: Emergency Medicine

## 2014-06-10 DIAGNOSIS — Z043 Encounter for examination and observation following other accident: Secondary | ICD-10-CM | POA: Insufficient documentation

## 2014-06-10 DIAGNOSIS — Y92091 Bathroom in other non-institutional residence as the place of occurrence of the external cause: Secondary | ICD-10-CM | POA: Diagnosis not present

## 2014-06-10 DIAGNOSIS — W01198A Fall on same level from slipping, tripping and stumbling with subsequent striking against other object, initial encounter: Secondary | ICD-10-CM | POA: Diagnosis not present

## 2014-06-10 DIAGNOSIS — Y9389 Activity, other specified: Secondary | ICD-10-CM | POA: Diagnosis not present

## 2014-06-10 DIAGNOSIS — R55 Syncope and collapse: Secondary | ICD-10-CM | POA: Diagnosis present

## 2014-06-10 DIAGNOSIS — Y998 Other external cause status: Secondary | ICD-10-CM | POA: Insufficient documentation

## 2014-06-10 DIAGNOSIS — W19XXXA Unspecified fall, initial encounter: Secondary | ICD-10-CM

## 2014-06-10 LAB — I-STAT CHEM 8, ED
BUN: 17 mg/dL (ref 6–23)
CHLORIDE: 102 mmol/L (ref 96–112)
CREATININE: 1 mg/dL (ref 0.50–1.35)
Calcium, Ion: 1.19 mmol/L (ref 1.12–1.23)
GLUCOSE: 99 mg/dL (ref 70–99)
HCT: 42 % (ref 39.0–52.0)
Hemoglobin: 14.3 g/dL (ref 13.0–17.0)
POTASSIUM: 4.1 mmol/L (ref 3.5–5.1)
SODIUM: 139 mmol/L (ref 135–145)
TCO2: 24 mmol/L (ref 0–100)

## 2014-06-10 LAB — I-STAT TROPONIN, ED: Troponin i, poc: 0 ng/mL (ref 0.00–0.08)

## 2014-06-10 MED ORDER — SODIUM CHLORIDE 0.9 % IV BOLUS (SEPSIS)
1000.0000 mL | Freq: Once | INTRAVENOUS | Status: AC
  Administered 2014-06-10: 1000 mL via INTRAVENOUS

## 2014-06-10 NOTE — Discharge Instructions (Signed)
Tests were good. Increase fluids. Eat regular meals. Follow-up your primary care doctor.

## 2014-06-10 NOTE — ED Provider Notes (Signed)
CSN: [id number]     Arrival date & time 06/10/14  1725 History   First MD Initiated Contact with Patient 06/10/14 1744     Chief Complaint  Patient presents with  . Loss of Consciousness     (Consider location/radiation/quality/duration/timing/severity/associated sxs/prior Treatment) HPI.....status post episode of nausea, vomiting, diarrhea at 2 AM today.  Patient was sitting on the toilet when he had a syncopal spell, fell off the toilet and struck his face. He admits to overdoing it physically yesterday. No neurological deficits, stiff neck, chest pain, dyspnea, fever, sweats, chills. He is normally quite healthy. Nonsmoker.  Past Medical History  Diagnosis Date  . No pertinent past medical history    Past Surgical History  Procedure Laterality Date  . Vasectomy     No family history on file. History  Substance Use Topics  . Smoking status: Never Smoker   . Smokeless tobacco: Never Used  . Alcohol Use: No    Review of Systems  All other systems reviewed and are negative.     Allergies  Review of patient's allergies indicates no known allergies.  Home Medications   Prior to Admission medications   Medication Sig Start Date End Date Taking? Authorizing Provider  ibuprofen (ADVIL,MOTRIN) 200 MG tablet Take 600 mg by mouth every 6 (six) hours as needed for moderate pain.   Yes Historical Provider, MD  diazepam (VALIUM) 5 MG tablet Take 1 tablet (5 mg total) by mouth every 6 (six) hours as needed (spasm, back pain). Patient not taking: Reported on 06/10/2014 03/29/12   Lyanne CoKevin M Campos, MD  ibuprofen (ADVIL,MOTRIN) 600 MG tablet Take 1 tablet (600 mg total) by mouth every 8 (eight) hours as needed for pain. Patient not taking: Reported on 06/10/2014 03/29/12   Lyanne CoKevin M Campos, MD  oxyCODONE-acetaminophen (PERCOCET/ROXICET) 5-325 MG per tablet Take 1 tablet by mouth every 4 (four) hours as needed for pain. Patient not taking: Reported on 06/10/2014 03/29/12   Lyanne CoKevin M Campos, MD    BP 133/79 mmHg  Pulse 56  Temp(Src) 97.5 F (36.4 C) (Oral)  Resp 18  SpO2 97% Physical Exam  Constitutional: He is oriented to person, place, and time. He appears well-developed and well-nourished.  HENT:  Head: Normocephalic and atraumatic.  Eyes: Conjunctivae and EOM are normal. Pupils are equal, round, and reactive to light.  Neck: Normal range of motion. Neck supple.  Cardiovascular: Normal rate and regular rhythm.   Pulmonary/Chest: Effort normal and breath sounds normal.  Abdominal: Soft. Bowel sounds are normal.  Musculoskeletal: Normal range of motion.  Neurological: He is alert and oriented to person, place, and time.  Skin: Skin is warm and dry.  Psychiatric: He has a normal mood and affect. His behavior is normal.  Nursing note and vitals reviewed.   ED Course  Procedures (including critical care time) Labs Review Labs Reviewed  I-STAT CHEM 8, ED  Rosezena SensorI-STAT TROPOININ, ED    Imaging Review Ct Maxillofacial Wo Cm  06/10/2014   CLINICAL DATA:  Left-sided facial pain following recent fall, initial encounter  EXAM: CT MAXILLOFACIAL WITHOUT CONTRAST  TECHNIQUE: Multidetector CT imaging of the maxillofacial structures was performed. Multiplanar CT image reconstructions were also generated. A small metallic BB was placed on the right temple in order to reliably differentiate right from left.  COMPARISON:  None.  FINDINGS: No acute fracture is identified. Only minimal soft tissue changes are noted on the left related to the recent injury. No focal hematoma is seen. The orbits and their  contents are within normal limits. The paranasal sinuses are unremarkable.  IMPRESSION: Minimal soft tissue changes consistent with a recent fall. No acute bony abnormality is noted.   Electronically Signed   By: Alcide Clever M.D.   On: 06/10/2014 19:17     EKG Interpretation   Date/Time:  Sunday June 10 2014 17:38:15 EST Ventricular Rate:  60 PR Interval:  192 QRS Duration: 89 QT  Interval:  401 QTC Calculation: 401 R Axis:   44 Text Interpretation:  Sinus rhythm ST elev, probable normal early repol  pattern Baseline wander in lead(s) V1 Confirmed by Adriana Simas  MD, Bretton Tandy ([id number])  on 06/10/2014 6:17:54 PM      MDM   Final diagnoses:  Fall  Syncope, unspecified syncope type    Patient appears well. Screening labs, EKG, CT maxillofacial all normal. Discussed finding with patient and his wife.   He has primary care follow-up    Donnetta Hutching, MD 06/10/14 2013

## 2014-06-10 NOTE — ED Notes (Addendum)
Pt from home c/o of syncopal episode at 0200. He reports hitting his head. Small abrasion to nose. He reports syncopal episode while on toilet. He also reports low hear rate recently.

## 2014-06-10 NOTE — ED Notes (Signed)
Patient states he feels "weak". Pt resting quietly, watching television.

## 2014-06-10 NOTE — ED Notes (Signed)
Bed: WA09 Expected date:  Expected time:  Means of arrival:  Comments: Triage 2 

## 2014-10-04 ENCOUNTER — Ambulatory Visit (INDEPENDENT_AMBULATORY_CARE_PROVIDER_SITE_OTHER): Payer: Managed Care, Other (non HMO) | Admitting: Family Medicine

## 2014-10-04 ENCOUNTER — Encounter: Payer: Self-pay | Admitting: Family Medicine

## 2014-10-04 ENCOUNTER — Ambulatory Visit (HOSPITAL_BASED_OUTPATIENT_CLINIC_OR_DEPARTMENT_OTHER)
Admission: RE | Admit: 2014-10-04 | Discharge: 2014-10-04 | Disposition: A | Discharge status: Home / Self Care | Payer: Managed Care, Other (non HMO) | Source: Ambulatory Visit | Attending: Family Medicine | Admitting: Family Medicine

## 2014-10-04 VITALS — BP 131/80 | Ht 67.0 in | Wt 166.0 lb

## 2014-10-04 DIAGNOSIS — S8991XA Unspecified injury of right lower leg, initial encounter: Secondary | ICD-10-CM

## 2014-10-04 DIAGNOSIS — M25561 Pain in right knee: Secondary | ICD-10-CM | POA: Diagnosis not present

## 2014-10-04 DIAGNOSIS — M25461 Effusion, right knee: Secondary | ICD-10-CM | POA: Diagnosis not present

## 2014-10-04 NOTE — Assessment & Plan Note (Signed)
exam consistent with ACL tear of the knee.  Has had persistent instability and an effusion.  Radiographs negative.  Will go ahead with MRI to assess.  NSAIDs, icing, bracing in meantime.

## 2014-10-04 NOTE — Progress Notes (Addendum)
PCP: No primary care provider on file.  Subjective:   HPI: Patient is a 54 y.o. male here for right knee pain.  Patient reports he's had aching of his right knee for a few months - mainly posterior. Has had episodes where knee feels like it's going to give out and hyperextend. Then 2 days ago was putting something on a truck, pushed up off right knee and felt sharp pain posterior knee with swelling. No bruising. No catching or locking. Has been using a brace and icing since then. Knee feels unstable.  Past Medical History  Diagnosis Date  . No pertinent past medical history     No current outpatient prescriptions on file prior to visit.   No current facility-administered medications on file prior to visit.    Past Surgical History  Procedure Laterality Date  . Vasectomy      No Known Allergies  History   Social History  . Marital Status: Married    Spouse Name: N/A  . Number of Children: N/A  . Years of Education: N/A   Occupational History  . Not on file.   Social History Main Topics  . Smoking status: Never Smoker   . Smokeless tobacco: Never Used  . Alcohol Use: No  . Drug Use: No  . Sexual Activity: Not Currently   Other Topics Concern  . Not on file   Social History Narrative    No family history on file.  BP 131/80 mmHg  Ht 5\' 7"  (1.702 m)  Wt 166 lb (75.297 kg)  BMI 25.99 kg/m2  Review of Systems: See HPI above.    Objective:  Physical Exam:  Gen: NAD  Right knee: Mod effusion.  No bruising, other deformity. No TTP currently. ROM 0 - 120 degrees. 2+ ant drawer and lachmanns.  Negative post drawer.  Negative valgus/varus testing. Negative mcmurrays, apleys, patellar apprehension. NV intact distally.    Assessment & Plan:  1. Right knee pain - exam consistent with ACL tear of the knee.  Has had persistent instability and an effusion.  Radiographs negative.  Will go ahead with MRI to assess.  NSAIDs, icing, bracing in  meantime.  Addendum:  MRI reviewed and discussed with patient.  He has underlying DJD but more concerning is a meniscus tear with fragment flipped into joint space.   His knee isn't locked on exam.  Surprisingly ACL isn't torn - very lax on exam.  Will go ahead with ortho referral to discuss arthroscopy.  Discussed it's possible they trial an intraarticular injection given he also has arthritis prior to pursuing surgery.

## 2014-10-04 NOTE — Patient Instructions (Addendum)
I'm concerned you tore the ACL in this knee. Get x-rays downstairs as you leave today - we'll call you with results then set up the MRI. I call you the business day after the MRI to go over results and next steps. In meantime continue with the knee brace, icing 15 minutes at a time 3-4 times a day.   Ibuprofen 600mg  three times a day with food OR aleve 2 tabs twice a day with food for pain and inflammation. Follow up will depend on the imaging results.  MRI: Tuesday June 28th at 930am Arrival time is 910am [facility] [address] [place] [address] [phone]  Prior Auth Number for MRI: [id number]

## 2014-10-09 ENCOUNTER — Ambulatory Visit
Admission: RE | Admit: 2014-10-09 | Discharge: 2014-10-09 | Disposition: A | Discharge status: Home / Self Care | Payer: Managed Care, Other (non HMO) | Source: Ambulatory Visit | Attending: Family Medicine | Admitting: Family Medicine

## 2014-10-09 DIAGNOSIS — S8991XA Unspecified injury of right lower leg, initial encounter: Secondary | ICD-10-CM

## 2014-10-10 NOTE — Addendum Note (Signed)
Addended by: Kathi SimpersWISE, Roslin Norwood F on: 10/10/2014 03:49 PM   Modules accepted: Orders

## 2014-10-18 ENCOUNTER — Other Ambulatory Visit: Payer: Self-pay | Admitting: Orthopedic Surgery

## 2014-10-18 ENCOUNTER — Ambulatory Visit (HOSPITAL_COMMUNITY): Payer: Managed Care, Other (non HMO) | Attending: Cardiovascular Disease

## 2014-10-18 DIAGNOSIS — I824Z1 Acute embolism and thrombosis of unspecified deep veins of right distal lower extremity: Secondary | ICD-10-CM | POA: Diagnosis not present

## 2014-10-18 DIAGNOSIS — M79604 Pain in right leg: Secondary | ICD-10-CM

## 2014-10-18 DIAGNOSIS — M7989 Other specified soft tissue disorders: Secondary | ICD-10-CM

## 2014-10-23 ENCOUNTER — Emergency Department (HOSPITAL_COMMUNITY): Payer: Managed Care, Other (non HMO)

## 2014-10-23 ENCOUNTER — Emergency Department (HOSPITAL_COMMUNITY)
Admission: EM | Admit: 2014-10-23 | Discharge: 2014-10-23 | Disposition: A | Discharge status: Home / Self Care | Payer: Managed Care, Other (non HMO) | Attending: Dermatology | Admitting: Dermatology

## 2014-10-23 ENCOUNTER — Encounter (HOSPITAL_COMMUNITY): Payer: Self-pay | Admitting: Emergency Medicine

## 2014-10-23 DIAGNOSIS — R079 Chest pain, unspecified: Secondary | ICD-10-CM | POA: Diagnosis not present

## 2014-10-23 DIAGNOSIS — R0602 Shortness of breath: Secondary | ICD-10-CM | POA: Diagnosis present

## 2014-10-23 DIAGNOSIS — Z7901 Long term (current) use of anticoagulants: Secondary | ICD-10-CM | POA: Diagnosis not present

## 2014-10-23 LAB — BASIC METABOLIC PANEL
Anion gap: 8 (ref 5–15)
BUN: 20 mg/dL (ref 6–20)
CHLORIDE: 103 mmol/L (ref 101–111)
CO2: 27 mmol/L (ref 22–32)
Calcium: 9.6 mg/dL (ref 8.9–10.3)
Creatinine, Ser: 0.78 mg/dL (ref 0.61–1.24)
GFR calc Af Amer: >60 mL/min (ref >60)
GLUCOSE: 82 mg/dL (ref 65–99)
POTASSIUM: 4.5 mmol/L (ref 3.5–5.1)
SODIUM: 138 mmol/L (ref 135–145)

## 2014-10-23 LAB — CBC
HEMATOCRIT: 42.3 % (ref 39.0–52.0)
HEMOGLOBIN: 14.2 g/dL (ref 13.0–17.0)
MCH: 30 pg (ref 26.0–34.0)
MCHC: 33.6 g/dL (ref 30.0–36.0)
MCV: 89.4 fL (ref 78.0–100.0)
Platelets: 301 10*3/uL (ref 150–400)
RBC: 4.73 MIL/uL (ref 4.22–5.81)
RDW: 12.2 % (ref 11.5–15.5)
WBC: 7.9 10*3/uL (ref 4.0–10.5)

## 2014-10-23 LAB — I-STAT TROPONIN, ED: TROPONIN I, POC: 0 ng/mL (ref 0.00–0.08)

## 2014-10-23 LAB — TROPONIN I

## 2014-10-23 MED ORDER — FAMOTIDINE 20 MG PO TABS
20.0000 mg | ORAL_TABLET | Freq: Once | ORAL | Status: DC
  Filled 2014-10-23: qty 1

## 2014-10-23 MED ORDER — IOHEXOL 350 MG/ML SOLN
100.0000 mL | Freq: Once | INTRAVENOUS | Status: AC | PRN
  Administered 2014-10-23: 100 mL via INTRAVENOUS

## 2014-10-23 MED ORDER — GI COCKTAIL ~~LOC~~
30.0000 mL | Freq: Once | ORAL | Status: DC
  Filled 2014-10-23: qty 30

## 2014-10-23 NOTE — Discharge Instructions (Signed)
It was our pleasure to provide your ER care today - we hope that you feel better.  For chest pain, follow up with cardiologist in coming week - see referral - call office to arrange appointment.  For dvt, continue xarelto, and follow up with primary care doctor.  Return to ER right away if worse, trouble breathing, high fevers, recurrent or persistent chest pain, other concern.      Chest Pain (Nonspecific) It is often hard to give a specific diagnosis for the cause of chest pain. There is always a chance that your pain could be related to something serious, such as a heart attack or a blood clot in the lungs. You need to follow up with your health care provider for further evaluation. CAUSES   Heartburn.  Pneumonia or bronchitis.  Anxiety or stress.  Inflammation around your heart (pericarditis) or lung (pleuritis or pleurisy).  A blood clot in the lung.  A collapsed lung (pneumothorax). It can develop suddenly on its own (spontaneous pneumothorax) or from trauma to the chest.  Shingles infection (herpes zoster virus). The chest wall is composed of bones, muscles, and cartilage. Any of these can be the source of the pain.  The bones can be bruised by injury.  The muscles or cartilage can be strained by coughing or overwork.  The cartilage can be affected by inflammation and become sore (costochondritis). DIAGNOSIS  Lab tests or other studies may be needed to find the cause of your pain. Your health care provider may have you take a test called an ambulatory electrocardiogram (ECG). An ECG records your heartbeat patterns over a 24-hour period. You may also have other tests, such as:  Transthoracic echocardiogram (TTE). During echocardiography, sound waves are used to evaluate how blood flows through your heart.  Transesophageal echocardiogram (TEE).  Cardiac monitoring. This allows your health care provider to monitor your heart rate and rhythm in real time.  Holter  monitor. This is a portable device that records your heartbeat and can help diagnose heart arrhythmias. It allows your health care provider to track your heart activity for several days, if needed.  Stress tests by exercise or by giving medicine that makes the heart beat faster. TREATMENT   Treatment depends on what may be causing your chest pain. Treatment may include:  Acid blockers for heartburn.  Anti-inflammatory medicine.  Pain medicine for inflammatory conditions.  Antibiotics if an infection is present.  You may be advised to change lifestyle habits. This includes stopping smoking and avoiding alcohol, caffeine, and chocolate.  You may be advised to keep your head raised (elevated) when sleeping. This reduces the chance of acid going backward from your stomach into your esophagus. Most of the time, nonspecific chest pain will improve within 2-3 days with rest and mild pain medicine.  HOME CARE INSTRUCTIONS   If antibiotics were prescribed, take them as directed. Finish them even if you start to feel better.  For the next few days, avoid physical activities that bring on chest pain. Continue physical activities as directed.  Do not use any tobacco products, including cigarettes, chewing tobacco, or electronic cigarettes.  Avoid drinking alcohol.  Only take medicine as directed by your health care provider.  Follow your health care provider's suggestions for further testing if your chest pain does not go away.  Keep any follow-up appointments you made. If you do not go to an appointment, you could develop lasting (chronic) problems with pain. If there is any problem keeping an appointment,  call to reschedule. SEEK MEDICAL CARE IF:   Your chest pain does not go away, even after treatment.  You have a rash with blisters on your chest.  You have a fever. SEEK IMMEDIATE MEDICAL CARE IF:   You have increased chest pain or pain that spreads to your arm, neck, jaw, back, or  abdomen.  You have shortness of breath.  You have an increasing cough, or you cough up blood.  You have severe back or abdominal pain.  You feel nauseous or vomit.  You have severe weakness.  You faint.  You have chills. This is an emergency. Do not wait to see if the pain will go away. Get medical help at once. Call your local emergency services (911 in [place] Do not drive yourself to the hospital. MAKE SURE YOU:   Understand these instructions.  Will watch your condition.  Will get help right away if you are not doing well or get worse. Document Released: 01/07/2005 Document Revised: 04/04/2013 Document Reviewed: 11/03/2007 [facility] Patient Information 2015 [place], [place]. This information is not intended to replace advice given to you by your health care provider. Make sure you discuss any questions you have with your health care provider.

## 2014-10-23 NOTE — ED Notes (Signed)
Bed: WA11 Expected date:  Expected time:  Means of arrival:  Comments: Hold for triage 

## 2014-10-23 NOTE — ED Provider Notes (Signed)
CSN: [id number]     Arrival date & time 10/23/14  1312 History   First MD Initiated Contact with Patient 10/23/14 1353     Chief Complaint  Patient presents with  . Shortness of Breath     (Consider location/radiation/quality/duration/timing/severity/associated sxs/prior Treatment) Patient is a 54 y.o. male presenting with shortness of breath. The history is provided by the patient.  Shortness of Breath Associated symptoms: no abdominal pain, no chest pain, no cough, no fever, no headaches, no neck pain, no rash, no sore throat and no vomiting   Patient c/o mid chest pain and sob starting last night, at rest. Pt s/p arthroscopy right knee for meniscal tear, and in subsequent days c/o right calf pain, at which time was dx w dvt and started on xarelto.  Pt has been on xarelto since 7/7, taking as directed. Chest discomfort began last night, and has persistent today. Pain constant, dull, mild, non radiating. Mild sob. No nv or diaphoresis. Denies cough or uri c/o. No fever or chills.  No preceding hx dvt or pe. No hx cad, states prior stress test neg. No fam hx cad. Non smoker. Denies heartburn.      Past Medical History  Diagnosis Date  . No pertinent past medical history    Past Surgical History  Procedure Laterality Date  . Vasectomy    . Knee surgery     History reviewed. No pertinent family history. History  Substance Use Topics  . Smoking status: Never Smoker   . Smokeless tobacco: Never Used  . Alcohol Use: No    Review of Systems  Constitutional: Negative for fever and chills.  HENT: Negative for sore throat.   Eyes: Negative for redness.  Respiratory: Positive for shortness of breath. Negative for cough.   Cardiovascular: Negative for chest pain.  Gastrointestinal: Negative for nausea, vomiting and abdominal pain.  Genitourinary: Negative for flank pain.  Musculoskeletal: Negative for back pain and neck pain.  Skin: Negative for rash.  Neurological: Negative for  headaches.  Hematological: Does not bruise/bleed easily.  Psychiatric/Behavioral: Negative for confusion.      Allergies  Review of patient's allergies indicates no known allergies.  Home Medications   Prior to Admission medications   Medication Sig Start Date End Date Taking? Authorizing Provider  Rivaroxaban (XARELTO) 15 MG TABS tablet Take 15 mg by mouth 2 (two) times daily. For 21 days 10/18/14  Yes Historical Provider, MD   BP 116/79 mmHg  Pulse 69  Temp(Src) 97.5 F (36.4 C) (Oral)  Resp 16  SpO2 100% Physical Exam  Constitutional: He is oriented to person, place, and time. He appears well-developed and well-nourished. No distress.  HENT:  Mouth/Throat: Oropharynx is clear and moist.  Eyes: Conjunctivae are normal. No scleral icterus.  Neck: Neck supple. No tracheal deviation present.  Cardiovascular: Normal rate, regular rhythm, normal heart sounds and intact distal pulses.  Exam reveals no gallop and no friction rub.   No murmur heard. Pulmonary/Chest: Effort normal and breath sounds normal. No accessory muscle usage. No respiratory distress. He exhibits no tenderness.  Abdominal: Soft. Bowel sounds are normal. He exhibits no distension. There is no tenderness.  Musculoskeletal: Normal range of motion. He exhibits no edema or tenderness.  Right knee arthroscopy incisions healing well without sign of infection. No gross leg/calf swelling. Distal pulses palp.   Neurological: He is alert and oriented to person, place, and time.  Skin: Skin is warm and dry. No rash noted. He is not diaphoretic.  Psychiatric: He has a normal mood and affect.  Nursing note and vitals reviewed.   ED Course  Procedures (including critical care time) Labs Review    Results for orders placed or performed during the hospital encounter of 10/23/14  Basic metabolic panel  Result Value Ref Range   Sodium 138 135 - 145 mmol/L   Potassium 4.5 3.5 - 5.1 mmol/L   Chloride 103 101 - 111 mmol/L    CO2 27 22 - 32 mmol/L   Glucose, Bld 82 65 - 99 mg/dL   BUN 20 6 - 20 mg/dL   Creatinine, Ser 4.09 0.61 - 1.24 mg/dL   Calcium 9.6 8.9 - 81.1 mg/dL   GFR calc non Af Amer >60 >60 mL/min   GFR calc Af Amer >60 >60 mL/min   Anion gap 8 5 - 15  CBC  Result Value Ref Range   WBC 7.9 4.0 - 10.5 K/uL   RBC 4.73 4.22 - 5.81 MIL/uL   Hemoglobin 14.2 13.0 - 17.0 g/dL   HCT 91.4 78.2 - 95.6 %   MCV 89.4 78.0 - 100.0 fL   MCH 30.0 26.0 - 34.0 pg   MCHC 33.6 30.0 - 36.0 g/dL   RDW 21.3 08.6 - 57.8 %   Platelets 301 150 - 400 K/uL  Troponin I  Result Value Ref Range   Troponin I <0.03 <0.031 ng/mL   Dg Chest 2 View  10/23/2014   CLINICAL DATA:  Chest discomfort with shortness of breath and chills for 1 day  EXAM: CHEST  2 VIEW  COMPARISON:  April 26, 2011.  FINDINGS: There is no edema or consolidation. The heart size and pulmonary vascularity are normal. No pneumothorax. No adenopathy. No bone lesions.  IMPRESSION: No edema or consolidation.   Electronically Signed   By: Bretta Bang III M.D.   On: 10/23/2014 13:47   Ct Angio Chest Pe W/cm &/or Wo Cm  10/23/2014   CLINICAL DATA:  Initial encounter for shortness of Breath starting last night after recent diagnosis of blood clot and right knee last week.  EXAM: CT ANGIOGRAPHY CHEST WITH CONTRAST  TECHNIQUE: Multidetector CT imaging of the chest was performed using the standard protocol during bolus administration of intravenous contrast. Multiplanar CT image reconstructions and MIPs were obtained to evaluate the vascular anatomy.  CONTRAST:  OMNIPAQUE IOHEXOL 350 MG/ML SOLN  COMPARISON:  None.  FINDINGS: Mediastinum / Lymph Nodes: There is no filling defect in the opacified pulmonary arteries to suggest the presence of an acute pulmonary embolus. No evidence for thoracic aortic aneurysm. No dissection flap is visible in the thoracic aorta.  There is no axillary, mediastinal, or hilar lymphadenopathy. The heart size is normal. No  pericardial effusion.  Lungs / Pleura: 3 mm right upper lobe nodule is seen on image 28. No focal airspace consolidation. No pulmonary edema or pleural effusion. Compressive atelectasis is noted in the lower lungs bilaterally. 5 mm left lower lobe nodule (image 50 series 8) may be atelectatic.  MSK / Soft Tissues: Bone windows reveal no worrisome lytic or sclerotic osseous lesions.  Upper Abdomen:  Unremarkable.  Review of the MIP images confirms the above findings.  IMPRESSION: 1. No CT evidence for acute pulmonary embolus. No other findings to explain the patient's history of shortness of breath. 2. 3 mm right upper lobe and 5 mm left lower lobe pulmonary nodules. If the patient is at high risk for bronchogenic carcinoma, follow-up chest CT at 6-12 months is recommended. If  the patient is at low risk for bronchogenic carcinoma, follow-up chest CT at 12 months is recommended. This recommendation follows the consensus statement: Guidelines for Management of Small Pulmonary Nodules Detected on CT Scans: A Statement from the Fleischner Society as published in Radiology 2005;237:395-400.   Electronically Signed   By: Kennith CenterEric  Mansell M.D.   On: 10/23/2014 15:07          EKG Interpretation   Date/Time:  Tuesday October 23 2014 13:18:35 EDT Ventricular Rate:  75 PR Interval:  181 QRS Duration: 92 QT Interval:  376 QTC Calculation: 420 R Axis:   48 Text Interpretation:  Sinus rhythm Abnormal R-wave progression, early  transition No significant change since last tracing Confirmed by  [facility] HospitalCHLOSSMAN MD, ERIN ([id number]) on 10/23/2014 1:32:29 PM      MDM   Iv ns. Labs.   Reviewed nursing notes and prior charts for additional history.   Ct angio chest neg for PE.  Recheck chest pain resolved.  Atypical symptoms, present at rest, felt not c/w acs.  Repeat troponin remains pending.  Signed out to Dr Blinda LeatherwoodPollina, if repeat trop neg and remains symptom free, anticipate d/c to home, w cardiology outpt f/u re cp.      Cathren LaineKevin Raman Featherston, MD 10/23/14 [phone]

## 2014-10-23 NOTE — ED Notes (Addendum)
Pt reports dx of blood clot in right knee last week. Pt put on blood thinners, reports SOB starting last night. Pt alert and oriented. Hx of recent knee surgery. Pt denies pain.

## 2015-04-18 ENCOUNTER — Other Ambulatory Visit (HOSPITAL_COMMUNITY): Payer: Self-pay | Admitting: Family Medicine

## 2015-04-18 DIAGNOSIS — R911 Solitary pulmonary nodule: Secondary | ICD-10-CM

## 2015-04-26 ENCOUNTER — Ambulatory Visit (HOSPITAL_COMMUNITY): Payer: Managed Care, Other (non HMO)

## 2016-02-21 ENCOUNTER — Ambulatory Visit (INDEPENDENT_AMBULATORY_CARE_PROVIDER_SITE_OTHER): Payer: 59 | Admitting: Physician Assistant

## 2016-02-21 VITALS — BP 122/80 | HR 61 | Temp 97.5°F | Resp 18 | Ht 67.0 in | Wt 181.0 lb

## 2016-02-21 DIAGNOSIS — R0789 Other chest pain: Secondary | ICD-10-CM

## 2016-02-21 DIAGNOSIS — R0781 Pleurodynia: Secondary | ICD-10-CM | POA: Diagnosis not present

## 2016-02-21 DIAGNOSIS — S29011A Strain of muscle and tendon of front wall of thorax, initial encounter: Secondary | ICD-10-CM

## 2016-02-21 MED ORDER — MELOXICAM 7.5 MG PO TABS: 7.5000 mg | ORAL_TABLET | Freq: Every day | ORAL | 2 refills | Status: AC

## 2016-02-21 NOTE — Progress Notes (Signed)
   Justin Wood  MRN: [id number] DOB: July 06, 1960  PCP: No PCP Per Patient  Subjective:  Pt is a pleasant 55 year old male presents to clinic for right rib pain x six days. He was lifting heavy jugs of water when he felt a pop on the front of his right rib cage. He felt immediate pain. Does not radiate. 7/10 pain. Hurts worse with coughing, sneezing or laughing. Has nit tried anything for the pain.  Denies SOB, difficulty breathing, chest pain, nausea, vomiting.   Review of Systems  Constitutional: Positive for activity change (rib pain). Negative for chills, diaphoresis and fever.  Respiratory: Negative for cough, chest tightness, shortness of breath and wheezing.   Cardiovascular: Positive for chest pain (front right side). Negative for palpitations and leg swelling.  Gastrointestinal: Negative for diarrhea, nausea and vomiting.  Musculoskeletal: Negative for back pain and neck pain.  Neurological: Negative for dizziness, syncope, light-headedness and headaches.  Psychiatric/Behavioral: Negative for sleep disturbance. The patient is not nervous/anxious.     There are no active problems to display for this patient.   No current outpatient prescriptions on file prior to visit.   No current facility-administered medications on file prior to visit.     No Known Allergies   Objective:  BP 122/80 (BP Location: Right Arm, Patient Position: Sitting, Cuff Size: Small)   Pulse 61   Temp 97.5 F (36.4 C) (Oral)   Resp 18   Ht 5\' 7"  (1.702 m)   Wt 181 lb (82.1 kg)   SpO2 98%   BMI 28.35 kg/m   Physical Exam  Constitutional: He is oriented to person, place, and time and well-developed, well-nourished, and in no distress. No distress.  Cardiovascular: Normal rate, regular rhythm and normal heart sounds.   Pulmonary/Chest: Effort normal and breath sounds normal. No respiratory distress. He has no decreased breath sounds. He exhibits tenderness and bony tenderness. He exhibits no  crepitus, no deformity and no retraction.    Neurological: He is alert and oriented to person, place, and time. GCS score is 15.  Skin: Skin is warm and dry.  Psychiatric: Mood, memory, affect and judgment normal.  Vitals reviewed.   Assessment and Plan :  1. Rib pain on right side 2. Intercostal muscle strain, initial encounter - Supportive care encouraged. Heat or ice as needed. Take slow, deep breaths for lung exercise.  - meloxicam (MOBIC) 7.5 MG tablet; Take 1 tablet (7.5 mg total) by mouth daily.  Dispense: 30 tablet; Refill: 2    Marco CollieWhitney Francisco Ostrovsky, PA-C  [facility] 02/21/2016 6:22 PM

## 2016-02-21 NOTE — Patient Instructions (Addendum)
Mobic is an antiinflammatory. You are prescribed 7.5 mg. You may take up to 64m.day (two pills). Play around with doses --- two in the a.m. Or two in p.m. Or one in a.m. And one in p.m.  Come back in 2-3 weeks if you are not better.   Thank you for coming in today. I hope you feel we met your needs.  Feel free to call [facility] if you have any questions or further requests.  Please consider signing up for MyChart if you do not already have it, as this is a great way to communicate with me.  Best,  Whitney McVey, PA-C   IF you received an x-ray today, you will receive an invoice from [facility]. Please contact [facility] at [phone] questions or concerns regarding your invoice.   IF you received labwork today, you will receive an invoice from [employer] Please contact [facility] at [phone] questions or concerns regarding your invoice.   Our billing staff will not be able to assist you with questions regarding bills from these companies.  You will be contacted with the lab results as soon as they are available. The fastest way to get your results is to activate your My Chart account. Instructions are located on the last page of this paperwork. If you have not heard from [facility] the results in 2 weeks, please contact this office.

## 2016-04-10 IMAGING — MR MR KNEE*R* W/O CM
4 of 6 series · 18 of 40 positions shown · non-contrast
Comparison: Radiographs 10/04/2014.

CLINICAL DATA: Posterior knee pain. Knee weakness. No prior
operation. No injection.

EXAM:
MRI OF THE RIGHT KNEE WITHOUT CONTRAST
TECHNIQUE: Multiplanar, multisequence MR imaging of the knee was performed. No
intravenous contrast was administered.

[Series 4: PD fat-sat · axial · 4.0mm · 0.29mm/px · z∈[-72,+83]mm · 9 of 32 slices shown (1 of 4)]
[im 1/32]
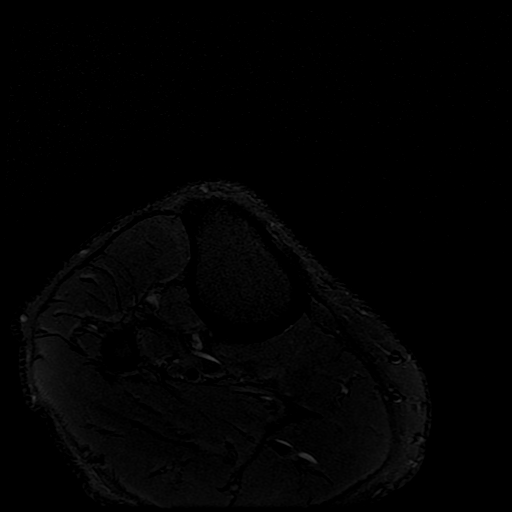
[im 4/32]
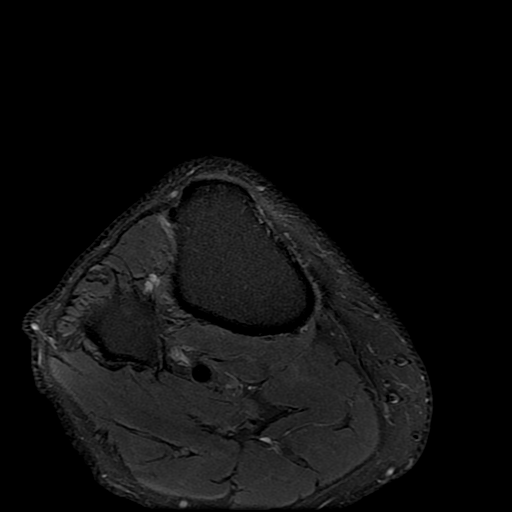
[im 8/32]
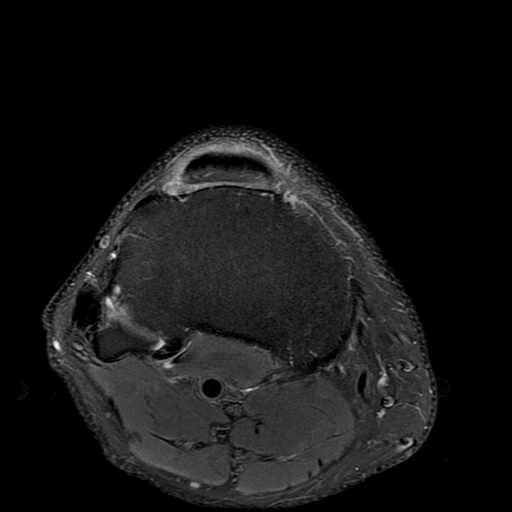
[im 12/32]
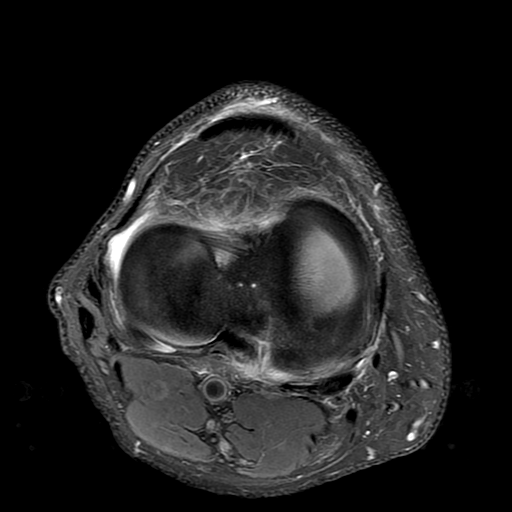
[im 16/32]
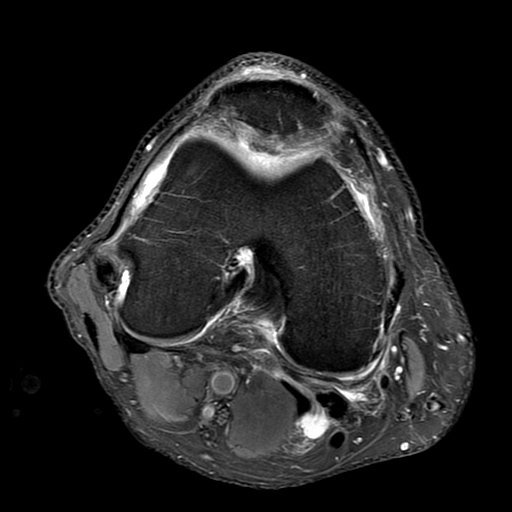
[im 20/32]
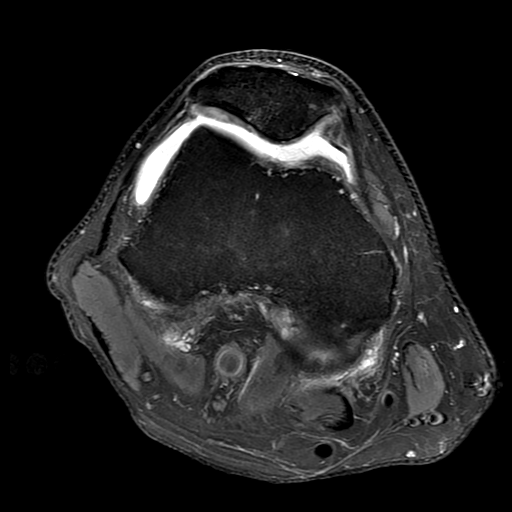
[im 24/32]
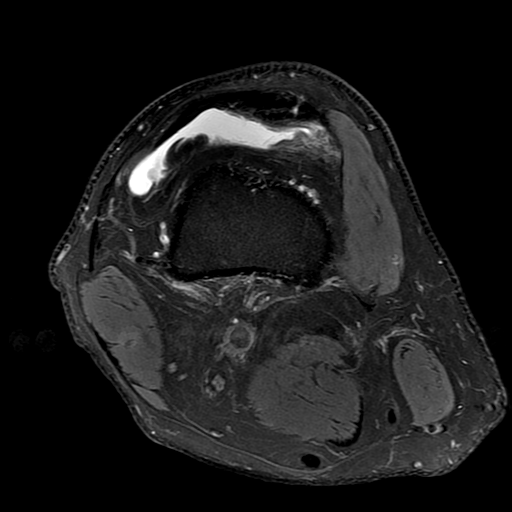
[im 28/32]
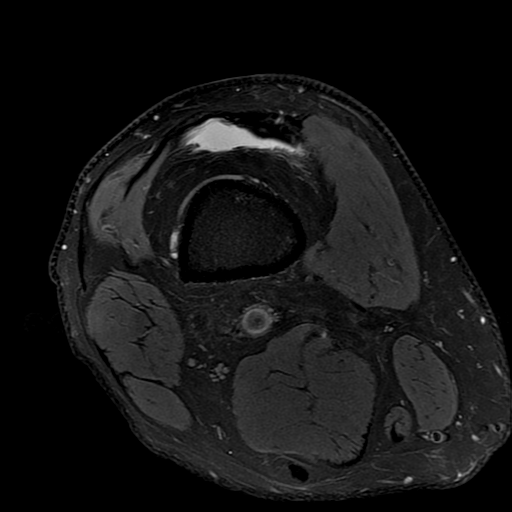
[im 32/32]
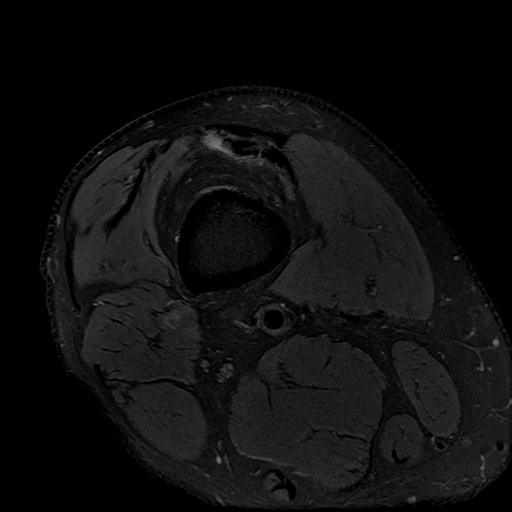

[Series 5: PD fat-sat · sagittal · 3.0mm · 0.27mm/px · 3 of 29 slices shown (2 of 4)]
[im 5/29]
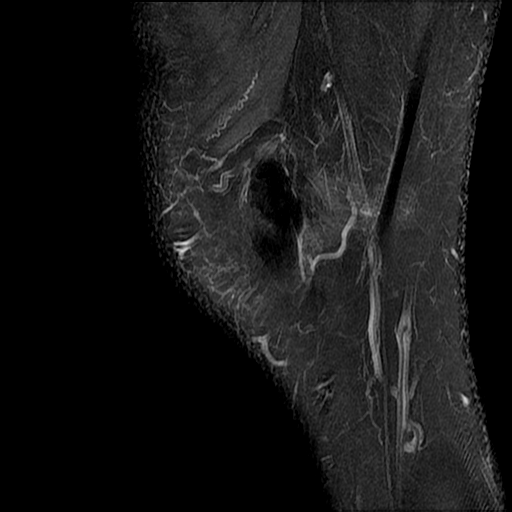
[im 15/29]
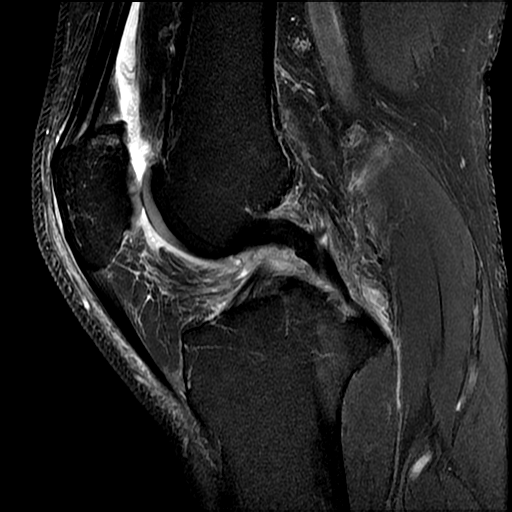
[im 24/29]
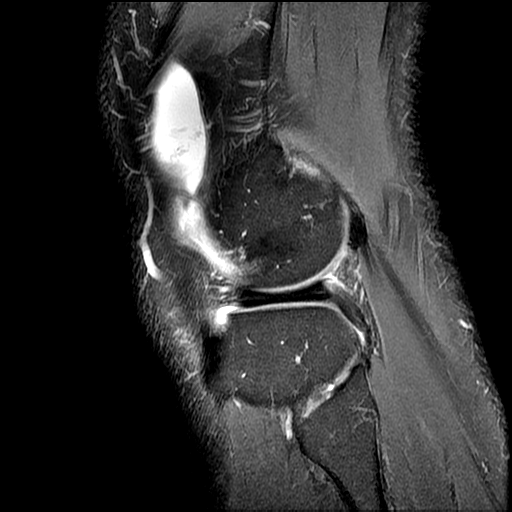

[Series 8: PD fat-sat · coronal · 4.0mm · 0.31mm/px · 3 of 30 slices shown (3 of 4)]
[im 5/30]
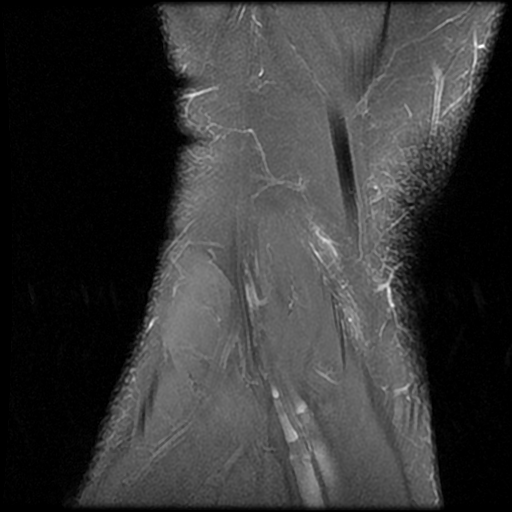
[im 15/30]
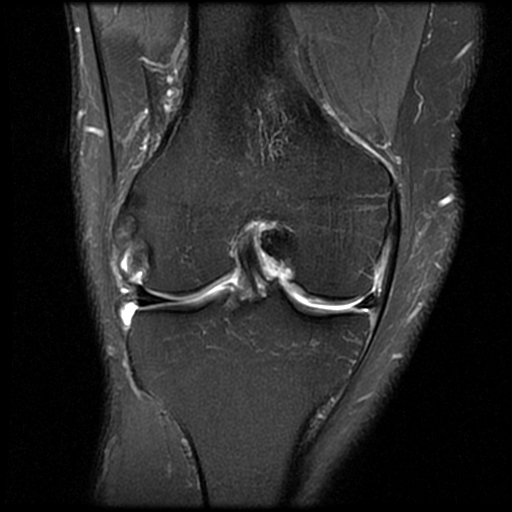
[im 25/30]
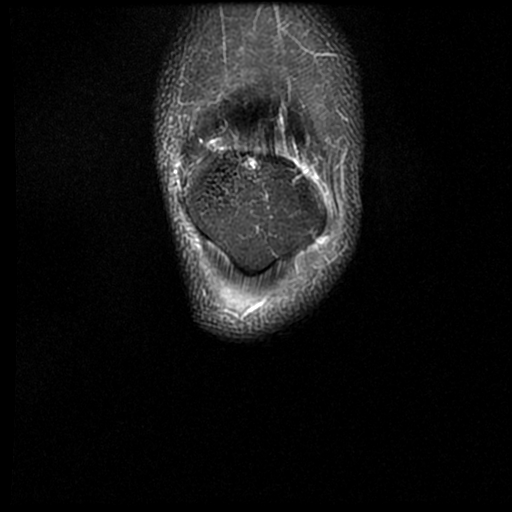

[Series 9: PD fat-sat · coronal · 2.0mm · 0.31mm/px · 3 of 13 slices shown (4 of 4)]
[im 1/13]
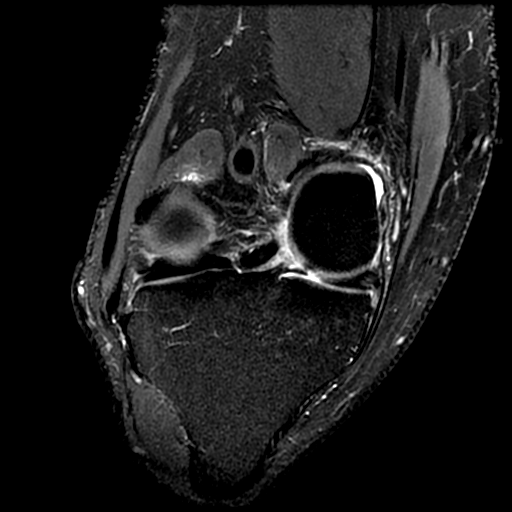
[im 7/13]
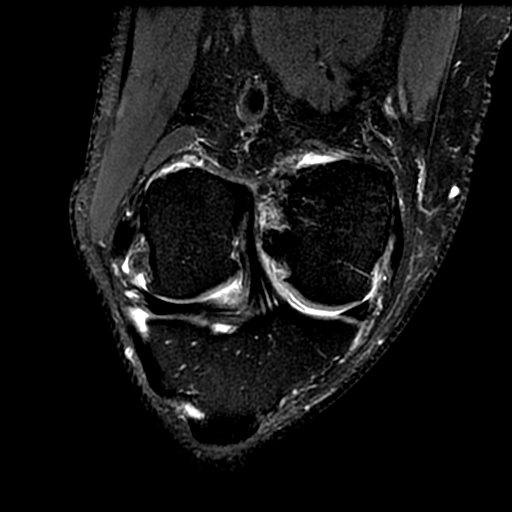
[im 13/13]
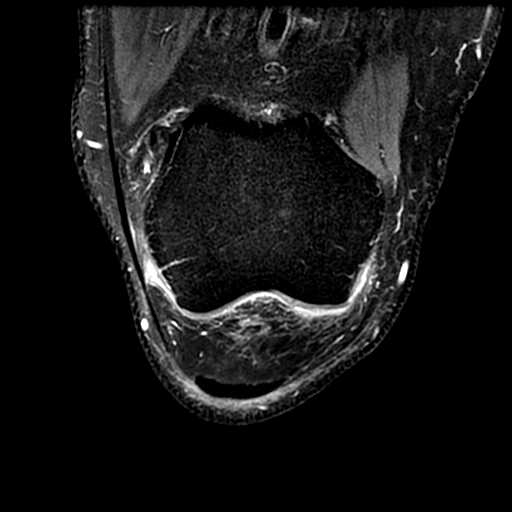

[18 of 40 positions shown; findings below may reference images not displayed]

FINDINGS: MENISCI

Medial meniscus: There is a radial tear of the posterior horn of the
medial meniscus. The tear is best seen on the sagittal images. There
is a low signal structure extending cranially that blends with the
posterior medial capsule and can be followed to the posterior horn
root, probably representing a flipped fragment into the posterior
superior recess (image 20 series 8). Variant ligamentous attachment
is considered less likely.

Lateral meniscus:  Intact.

LIGAMENTS

Cruciates:  Intact.

Collaterals:  Intact.

CARTILAGE

Patellofemoral: Moderate patellofemoral osteoarthritis is present.
Diffuse grade II lateral patellar facet chondromalacia with focal
areas of grade III in the far lateral facet. Relative sparing of
trochlear cartilage. There is a small area of at least grade III
chondral fissuring in the inferior medial trochlear sulcus (image 17
series 5).

Medial: Mild osteoarthritis with grade II chondromalacia of the
weight-bearing medial femoral condyle.

Lateral:  Intact.

Joint:  Moderate effusion and degenerative synovitis.

Popliteal Fossa: Small Baker cyst. Synovitis in the posterior medial
aspect of the knee capsule. This is probably secondary to chronic
meniscal tear.

Extensor Mechanism:  Intact.

Bones:  No significant extra-articular findings.
IMPRESSION: 1. Radial tear of the posterior horn of the medial meniscus.
2. Probable flipped fragment of the posterior horn of the medial
meniscus extending into the posterior superior joint recess.
3. Moderate patellofemoral and mild medial compartment
osteoarthritis.
4. Moderate effusion, small Baker's cyst and degenerative synovitis
of the knee.

## 2016-05-07 ENCOUNTER — Other Ambulatory Visit: Payer: Self-pay | Admitting: Physician Assistant

## 2016-05-07 DIAGNOSIS — R911 Solitary pulmonary nodule: Secondary | ICD-10-CM

## 2016-05-08 ENCOUNTER — Ambulatory Visit
Admission: RE | Admit: 2016-05-08 | Discharge: 2016-05-08 | Disposition: A | Discharge status: Home / Self Care | Payer: 59 | Source: Ambulatory Visit | Attending: Physician Assistant | Admitting: Physician Assistant

## 2016-05-08 DIAGNOSIS — R911 Solitary pulmonary nodule: Secondary | ICD-10-CM

## 2023-01-24 ENCOUNTER — Encounter (HOSPITAL_COMMUNITY): Payer: Self-pay

## 2023-01-24 ENCOUNTER — Emergency Department (HOSPITAL_COMMUNITY): Payer: Self-pay

## 2023-01-24 ENCOUNTER — Emergency Department (HOSPITAL_COMMUNITY)
Admission: EM | Admit: 2023-01-24 | Discharge: 2023-01-24 | Disposition: A | Discharge status: Home / Self Care | Payer: Self-pay | Attending: Emergency Medicine | Admitting: Emergency Medicine

## 2023-01-24 ENCOUNTER — Other Ambulatory Visit: Payer: Self-pay

## 2023-01-24 DIAGNOSIS — I10 Essential (primary) hypertension: Secondary | ICD-10-CM | POA: Insufficient documentation

## 2023-01-24 DIAGNOSIS — R5383 Other fatigue: Secondary | ICD-10-CM | POA: Insufficient documentation

## 2023-01-24 DIAGNOSIS — R519 Headache, unspecified: Secondary | ICD-10-CM | POA: Insufficient documentation

## 2023-01-24 DIAGNOSIS — Z20822 Contact with and (suspected) exposure to covid-19: Secondary | ICD-10-CM | POA: Insufficient documentation

## 2023-01-24 DIAGNOSIS — R0789 Other chest pain: Secondary | ICD-10-CM

## 2023-01-24 LAB — CBC WITH DIFFERENTIAL/PLATELET
Abs Immature Granulocytes: 0.01 10*3/uL (ref 0.00–0.07)
Basophils Absolute: 0 10*3/uL (ref 0.0–0.1)
Basophils Relative: 1 %
Eosinophils Absolute: 0.1 10*3/uL (ref 0.0–0.5)
Eosinophils Relative: 1 %
HCT: 46 % (ref 39.0–52.0)
Hemoglobin: 15.5 g/dL (ref 13.0–17.0)
Immature Granulocytes: 0 %
Lymphocytes Relative: 39 %
Lymphs Abs: 2 10*3/uL (ref 0.7–4.0)
MCH: 31.5 pg (ref 26.0–34.0)
MCHC: 33.7 g/dL (ref 30.0–36.0)
MCV: 93.5 fL (ref 80.0–100.0)
Monocytes Absolute: 0.9 10*3/uL (ref 0.1–1.0)
Monocytes Relative: 17 %
Neutro Abs: 2.1 10*3/uL (ref 1.7–7.7)
Neutrophils Relative %: 42 %
Platelets: 185 10*3/uL (ref 150–400)
RBC: 4.92 MIL/uL (ref 4.22–5.81)
RDW: 12.5 % (ref 11.5–15.5)
WBC: 5 10*3/uL (ref 4.0–10.5)
nRBC: 0 % (ref 0.0–0.2)

## 2023-01-24 LAB — BASIC METABOLIC PANEL
Anion gap: 8 (ref 5–15)
BUN: 16 mg/dL (ref 8–23)
CO2: 26 mmol/L (ref 22–32)
Calcium: 9.1 mg/dL (ref 8.9–10.3)
Chloride: 101 mmol/L (ref 98–111)
Creatinine, Ser: 0.9 mg/dL (ref 0.61–1.24)
GFR, Estimated: >60 mL/min (ref >60)
Glucose, Bld: 146 mg/dL — ABNORMAL HIGH (ref 70–99)
Potassium: 3.9 mmol/L (ref 3.5–5.1)
Sodium: 135 mmol/L (ref 135–145)

## 2023-01-24 LAB — TROPONIN I (HIGH SENSITIVITY): Troponin I (High Sensitivity): 3 ng/L (ref <18)

## 2023-01-24 LAB — RESP PANEL BY RT-PCR (RSV, FLU A&B, COVID)  RVPGX2
Influenza A by PCR: NEGATIVE
Influenza B by PCR: NEGATIVE
Resp Syncytial Virus by PCR: NEGATIVE
SARS Coronavirus 2 by RT PCR: NEGATIVE

## 2023-01-24 MED ORDER — IOHEXOL 350 MG/ML SOLN
100.0000 mL | Freq: Once | INTRAVENOUS | Status: AC | PRN
  Administered 2023-01-24: 100 mL via INTRAVENOUS

## 2023-01-24 MED ORDER — ACETAMINOPHEN 325 MG PO TABS
650.0000 mg | ORAL_TABLET | Freq: Once | ORAL | Status: AC
  Administered 2023-01-24: 650 mg via ORAL
  Filled 2023-01-24: qty 2

## 2023-01-24 NOTE — Discharge Instructions (Signed)
Justin Wood, Justin Wood were evaluated in the ER for chest pain, shortness of breath, headache, and chills. You did not have a heart attack, you have no signs of infection, you have no signs of blood vessel rupture from high blood pressure. Your workup was negative for emergent issues and you are safe to return home. Take tylenol for your headache. Return for a severe and sudden return of the headache, crushing chest pain, ripping back or neck pain. Please establish care with a primary care doctor as soon as possible. State that you were recently seen in the ER to get an earlier appointment.

## 2023-01-24 NOTE — ED Provider Notes (Cosign Needed Addendum)
[place] EMERGENCY DEPARTMENT AT [facility] Provider Note   CSN: [id number] Arrival date & time: 01/24/23  2024     History  Chief Complaint  Patient presents with   Chest Pain    Justin Wood is a 62 y.o. male.  Justin Wood is a 62 yo M with no PMH who takes no medications who presented to [facility] ED accompanied by his wife for symptoms that started yesterday and include: chest pain, dyspnea, headache, chills, hypertension. The chest pain is 2/10 and dull, constant but is not worse with exertion, and not crushing in nature. He reported moving a large piece of machinery yesterday. The dyspnea is also new since yesterday, it is worse when he sits upright, not associated with a time of day, and not limiting his activity. The headache is 5/10 in the posterior head. The chills are new since yesterday. The hypertension was via a home device and measured as high as 200 systolic. He notes being aware of his heartbeat at times but is not aware of skipped beats or racing heart. All of these new symptoms in combination inspired Justin Wood to seek further evaluation. He denies fever, syncope, dizziness, abdominal pain, nausea, vomiting, dysuria, changes in bowel habits, changes in eating, and sick contacts.   Chest Pain Associated symptoms: fatigue, headache and shortness of breath   Associated symptoms: no abdominal pain, no cough, no dizziness, no fever, no nausea, no palpitations, no vomiting and no weakness        Home Medications Prior to Admission medications   Medication Sig Start Date End Date Taking? Authorizing Provider  acetaminophen (TYLENOL) 500 MG tablet Take 500 mg by mouth every 6 (six) hours as needed for headache.   Yes [provider]  ibuprofen (ADVIL) 200 MG tablet Take 200 mg by mouth every 6 (six) hours as needed for headache.   Yes [provider]  meloxicam (MOBIC) 7.5 MG tablet Take 1 tablet (7.5 mg total) by mouth daily. Patient not taking:  Reported on 01/24/2023 02/21/16   McVey, Madelaine Bhat, PA-C      Allergies    Patient has no known allergies.    Review of Systems   Review of Systems  Constitutional:  Positive for chills and fatigue. Negative for fever.  HENT:  Negative for congestion and rhinorrhea.   Eyes:  Negative for pain and visual disturbance.  Respiratory:  Positive for shortness of breath. Negative for cough, chest tightness and wheezing.   Cardiovascular:  Positive for chest pain. Negative for palpitations and leg swelling.  Gastrointestinal:  Negative for abdominal distention, abdominal pain, diarrhea, nausea and vomiting.  Genitourinary:  Negative for dysuria.  Musculoskeletal:  Negative for arthralgias, myalgias and neck pain.  Neurological:  Positive for headaches. Negative for dizziness, syncope and weakness.  Psychiatric/Behavioral:  Negative for confusion.     Physical Exam Updated Vital Signs BP (!) 135/92   Pulse 73 Comment: Simultaneous filing. User may not have seen previous data.  Temp 98.2 F (36.8 C) (Oral)   Resp 18 Comment: Simultaneous filing. User may not have seen previous data.  SpO2 97% Comment: Simultaneous filing. User may not have seen previous data. Physical Exam Constitutional:      General: He is not in acute distress.    Appearance: He is well-developed. He is not ill-appearing.  HENT:     Head: Normocephalic and atraumatic.  Cardiovascular:     Rate and Rhythm: Normal rate and regular rhythm.  Heart sounds: Normal heart sounds.  Pulmonary:     Effort: Pulmonary effort is normal.     Breath sounds: Normal breath sounds. No wheezing or rales.  Chest:     Chest wall: No tenderness.  Abdominal:     General: Bowel sounds are normal.     Palpations: Abdomen is soft.     Tenderness: There is no abdominal tenderness.  Musculoskeletal:     Right lower leg: No edema.     Left lower leg: No edema.  Skin:    General: Skin is warm and dry.     Capillary Refill:  Capillary refill takes less than 2 seconds.  Neurological:     General: No focal deficit present.     Mental Status: He is alert and oriented to person, place, and time.  Psychiatric:        Mood and Affect: Mood normal.        Behavior: Behavior normal.     ED Results / Procedures / Treatments   Labs (all labs ordered are listed, but only abnormal results are displayed) Labs Reviewed  BASIC METABOLIC PANEL - Abnormal; Notable for the following components:      Result Value   Glucose, Bld 146 (*)    All other components within normal limits  RESP PANEL BY RT-PCR (RSV, FLU A&B, COVID)  [id number]  CBC WITH DIFFERENTIAL/PLATELET  TROPONIN I (HIGH SENSITIVITY)  TROPONIN I (HIGH SENSITIVITY)    EKG EKG Interpretation Date/Time:  Sunday January 24 2023 20:34:42 EDT Ventricular Rate:  73 PR Interval:  192 QRS Duration:  100 QT Interval:  361 QTC Calculation: 398 R Axis:   -1  Text Interpretation: Sinus rhythm RSR' in V1 or V2, right VCD or RVH ST elevation, consider inferior injury Baseline wander in lead(s) V2 V3 No significant change since last tracing Confirmed by Lorre Nick ([id number]) on 01/24/2023 8:45:39 PM  Radiology CT Angio Head Neck W WO CM  Result Date: 01/24/2023 CLINICAL DATA:  Severe headache EXAM: CT ANGIOGRAPHY HEAD AND NECK WITH AND WITHOUT CONTRAST TECHNIQUE: Multidetector CT imaging of the head and neck was performed using the standard protocol during bolus administration of intravenous contrast. Multiplanar CT image reconstructions and MIPs were obtained to evaluate the vascular anatomy. Carotid stenosis measurements (when applicable) are obtained utilizing NASCET criteria, using the distal internal carotid diameter as the denominator. RADIATION DOSE REDUCTION: This exam was performed according to the departmental dose-optimization program which includes automated exposure control, adjustment of the mA and/or kV according to patient size and/or use of iterative  reconstruction technique. CONTRAST:  OMNIPAQUE IOHEXOL 350 MG/ML SOLN COMPARISON:  None Available. FINDINGS: CT HEAD FINDINGS Brain: No mass, hemorrhage or extra-axial collection. Normal appearance of the brain for age. Skull: The visualized skull base, calvarium and extracranial soft tissues are normal. Sinuses/Orbits: No fluid levels or advanced mucosal thickening of the visualized paranasal sinuses. No mastoid or middle ear effusion. Normal orbits. CTA NECK FINDINGS SKELETON: No acute abnormality or high grade bony spinal canal stenosis. OTHER NECK: Normal pharynx, larynx and major salivary glands. No cervical lymphadenopathy. Unremarkable thyroid gland. UPPER CHEST: No pneumothorax or pleural effusion. No nodules or masses. AORTIC ARCH: There is no calcific atherosclerosis of the aortic arch. Conventional 3 vessel aortic branching pattern. RIGHT CAROTID SYSTEM: Normal without aneurysm, dissection or stenosis. LEFT CAROTID SYSTEM: Normal without aneurysm, dissection or stenosis. VERTEBRAL ARTERIES: Left dominant configuration. There is no dissection, occlusion or flow-limiting stenosis to the skull base (V1-V3 segments). CTA  HEAD FINDINGS POSTERIOR CIRCULATION: --Vertebral arteries: Normal V4 segments. --Inferior cerebellar arteries: Normal. --Basilar artery: Normal. --Superior cerebellar arteries: Normal. --Posterior cerebral arteries (PCA): Normal. ANTERIOR CIRCULATION: --Intracranial internal carotid arteries: Normal. --Anterior cerebral arteries (ACA): Normal. --Middle cerebral arteries (MCA): Normal. VENOUS SINUSES: As permitted by contrast timing, patent. ANATOMIC VARIANTS: None Review of the MIP images confirms the above findings. IMPRESSION: 1. Normal CTA of the head and neck. 2. No acute intracranial abnormality. Electronically Signed   By: Deatra Robinson M.D.   On: 01/24/2023 23:02   CT Angio Chest/Abd/Pel for Dissection W and/or W/WO  Result Date: 01/24/2023 CLINICAL DATA:  Acute aortic  syndrome (AAS) suspected. Chest pain, shortness of breath. EXAM: CT ANGIOGRAPHY CHEST, ABDOMEN AND PELVIS TECHNIQUE: Non-contrast CT of the chest was initially obtained. Multidetector CT imaging through the chest, abdomen and pelvis was performed using the standard protocol during bolus administration of intravenous contrast. Multiplanar reconstructed images and MIPs were obtained and reviewed to evaluate the vascular anatomy. RADIATION DOSE REDUCTION: This exam was performed according to the departmental dose-optimization program which includes automated exposure control, adjustment of the mA and/or kV according to patient size and/or use of iterative reconstruction technique. CONTRAST:  OMNIPAQUE IOHEXOL 350 MG/ML SOLN COMPARISON:  05/08/2016 FINDINGS: CTA CHEST FINDINGS Cardiovascular: Heart is normal size. Aorta is normal caliber. No dissection No filling defects in the pulmonary arteries to suggest pulmonary emboli. Mediastinum/Nodes: No mediastinal, hilar, or axillary adenopathy. Trachea and esophagus are unremarkable. Thyroid unremarkable. Lungs/Pleura: Lungs are clear. No focal airspace opacities or suspicious nodules. No effusions. Musculoskeletal: Chest wall soft tissues are unremarkable. No acute bony abnormality. Review of the MIP images confirms the above findings. CTA ABDOMEN AND PELVIS FINDINGS VASCULAR Aorta: Normal caliber aorta without aneurysm, dissection, vasculitis or significant stenosis. Celiac: Patent without evidence of aneurysm, dissection, vasculitis or significant stenosis. SMA: Patent without evidence of aneurysm, dissection, vasculitis or significant stenosis. Renals: Both renal arteries are patent without evidence of aneurysm, dissection, vasculitis, fibromuscular dysplasia or significant stenosis. IMA: Patent without evidence of aneurysm, dissection, vasculitis or significant stenosis. Inflow: Patent without evidence of aneurysm, dissection, vasculitis or significant stenosis.  Veins: No obvious venous abnormality within the limitations of this arterial phase study. Review of the MIP images confirms the above findings. NON-VASCULAR Hepatobiliary: No focal hepatic abnormality. Gallbladder unremarkable. Pancreas: No focal abnormality or ductal dilatation. Spleen: No focal abnormality.  Normal size. Adrenals/Urinary Tract: No adrenal abnormality. No focal renal abnormality. No stones or hydronephrosis. Urinary bladder is unremarkable. Stomach/Bowel: Colonic diverticulosis. No active diverticulitis. Stomach and small bowel decompressed. Normal appendix. Lymphatic: No adenopathy Reproductive: Prostate enlargement Other: No free fluid or free air. Moderate to large right inguinal hernia containing fat. Musculoskeletal: No acute bony abnormality. Review of the MIP images confirms the above findings. IMPRESSION: No evidence of aortic aneurysm or dissection. No evidence of pulmonary embolus. No acute cardiopulmonary disease. No acute findings in the abdomen or pelvis. Colonic diverticulosis. Electronically Signed   By: Charlett Nose M.D.   On: 01/24/2023 23:00   DG Chest 2 View  Result Date: 01/24/2023 CLINICAL DATA:  Chest pain and shortness of breath. EXAM: CHEST - 2 VIEW COMPARISON:  Chest radiograph dated 10/23/2014. FINDINGS: The heart size and mediastinal contours are within normal limits. Both lungs are clear. The visualized skeletal structures are unremarkable. IMPRESSION: No active cardiopulmonary disease. Electronically Signed   By: Elgie Collard M.D.   On: 01/24/2023 20:58    Procedures Procedures    Medications Ordered in ED Medications  acetaminophen (TYLENOL) tablet 650 mg (650 mg Oral Given 01/24/23 2136)  iohexol (OMNIPAQUE) 350 MG/ML injection 100 mL (100 mLs Intravenous Contrast Given 01/24/23 2217)    ED Course/ Medical Decision Making/ A&P                                 Medical Decision Making Pt with a constellation of symptoms including chest pain,  dyspnea, headache, L arm numbness that started yesterday in setting of severe hypertension via home measurement. Taken together, will evaluate for possible dissection, ACS, infection, anemia. Initial vitals are unremarkable. Viral panel negative. Troponin negative. Electrolytes normal. No white count or anemia. CXR without acute process. CT angio chest and head are unremarkable. Critical causes of his symptoms are thus ruled out. Pt likely has hypertension, might have viral prodrome as well. Will plan to discharge patient with strict return precautions. Will urge follow up with primary care.  Amount and/or Complexity of Data Reviewed Labs: ordered. Radiology: ordered.  Risk OTC drugs. Prescription drug management.          Final Clinical Impression(s) / ED Diagnoses Final diagnoses:  Primary hypertension  Musculoskeletal chest pain    Rx / DC Orders ED Discharge Orders     None         Katheran James, DO 01/24/23 2312    Katheran James, DO 01/24/23 2313    Katheran James, DO 01/24/23 2323    Lorre Nick, MD 01/27/23 [phone]

## 2023-01-24 NOTE — ED Provider Notes (Signed)
I saw and evaluated the patient, reviewed the resident's note and I agree with the findings and plan.  EKG Interpretation Date/Time:  Sunday January 24 2023 20:34:42 EDT Ventricular Rate:  73 PR Interval:  192 QRS Duration:  100 QT Interval:  361 QTC Calculation: 398 R Axis:   -1  Text Interpretation: Sinus rhythm RSR' in V1 or V2, right VCD or RVH ST elevation, consider inferior injury Baseline wander in lead(s) V2 V3 No significant change since last tracing Confirmed by Lorre Nick ([id number]) on 01/24/2023 8:45:39 PM   Patient presents with headache which began yesterday at rest.  States that it has been persistent in nature.  Not associate nausea or vomiting.  Patient is also had constant chest pain which began yesterday as well 2.  States he took his blood pressure at home yesterday and it was 230/80.  No prior history of hypertension.  States that is gradual resolved.  Did have some associated right hand tingling which has been coming and going.  Nothing makes that better or worse.  Denies any syncope or near syncope.  On physical exam, he has no asymmetry to his pulses.  Concern for possible dissection due to his chest pain and high blood pressure.  His EKG per my interpretation showed no acute findings.  Chest x-ray showed no wide mediastinum.  First troponin here is negative.  Low suspicion for ACS.  Will sign off to next provider   Lorre Nick, MD 01/24/23 2157

## 2023-01-24 NOTE — ED Triage Notes (Signed)
Pt. Arrives POV for chest pain and SOB since last night. Pt. Also experiencing sharp head pain and left arm numbness. No cardiac history. Pt. Denies nausea and vomiting.
